# Patient Record
Sex: Female | Born: 1970 | Race: Black or African American | Hispanic: No | Marital: Single | Smoking: Never smoker
Health system: Southern US, Community
[De-identification: ages and names within clinical notes are randomized; demographics above are authoritative.]

## PROBLEM LIST (undated history)

## (undated) DIAGNOSIS — J302 Other seasonal allergic rhinitis: Principal | ICD-10-CM

## (undated) DIAGNOSIS — F411 Generalized anxiety disorder: Secondary | ICD-10-CM

## (undated) DIAGNOSIS — R002 Palpitations: Secondary | ICD-10-CM

## (undated) DIAGNOSIS — G43909 Migraine, unspecified, not intractable, without status migrainosus: Secondary | ICD-10-CM

## (undated) DIAGNOSIS — E7800 Pure hypercholesterolemia, unspecified: Secondary | ICD-10-CM

## (undated) DIAGNOSIS — R011 Cardiac murmur, unspecified: Secondary | ICD-10-CM

## (undated) HISTORY — DX: Pure hypercholesterolemia, unspecified: E78.00

## (undated) HISTORY — DX: Generalized anxiety disorder: F41.1

## (undated) HISTORY — DX: Cardiac murmur, unspecified: R01.1

## (undated) HISTORY — DX: Other seasonal allergic rhinitis: J30.2

## (undated) HISTORY — DX: Migraine, unspecified, not intractable, without status migrainosus: G43.909

## (undated) HISTORY — DX: Palpitations: R00.2

---

## 1998-06-14 ENCOUNTER — Other Ambulatory Visit: Admission: RE | Admit: 1998-06-14 | Discharge: 1998-06-14 | Payer: Self-pay | Admitting: Obstetrics & Gynecology

## 1998-07-28 ENCOUNTER — Encounter: Payer: Self-pay | Admitting: Emergency Medicine

## 1998-07-28 ENCOUNTER — Emergency Department (HOSPITAL_COMMUNITY): Admission: EM | Admit: 1998-07-28 | Discharge: 1998-07-28 | Payer: Self-pay | Admitting: Emergency Medicine

## 1998-08-20 ENCOUNTER — Ambulatory Visit (HOSPITAL_COMMUNITY): Admission: RE | Admit: 1998-08-20 | Discharge: 1998-08-20 | Payer: Self-pay | Admitting: Cardiology

## 1999-06-28 ENCOUNTER — Other Ambulatory Visit: Admission: RE | Admit: 1999-06-28 | Discharge: 1999-06-28 | Payer: Self-pay | Admitting: Obstetrics and Gynecology

## 2001-11-28 ENCOUNTER — Other Ambulatory Visit: Admission: RE | Admit: 2001-11-28 | Discharge: 2001-11-28 | Payer: Self-pay | Admitting: Obstetrics and Gynecology

## 2002-11-19 ENCOUNTER — Other Ambulatory Visit: Admission: RE | Admit: 2002-11-19 | Discharge: 2002-11-19 | Payer: Self-pay | Admitting: Obstetrics and Gynecology

## 2003-02-04 ENCOUNTER — Emergency Department (HOSPITAL_COMMUNITY): Admission: EM | Admit: 2003-02-04 | Discharge: 2003-02-04 | Payer: Self-pay | Admitting: Emergency Medicine

## 2004-09-28 ENCOUNTER — Ambulatory Visit (HOSPITAL_COMMUNITY): Admission: RE | Admit: 2004-09-28 | Discharge: 2004-09-28 | Payer: Self-pay | Admitting: Cardiology

## 2008-07-12 ENCOUNTER — Emergency Department (HOSPITAL_COMMUNITY): Admission: EM | Admit: 2008-07-12 | Discharge: 2008-07-12 | Payer: Self-pay | Admitting: Family Medicine

## 2011-07-04 LAB — POCT URINALYSIS DIP (DEVICE)
Glucose, UA: NEGATIVE
Ketones, ur: NEGATIVE
Operator id: 282151
Protein, ur: 100 — AB
Specific Gravity, Urine: 1.02

## 2011-07-04 LAB — URINE CULTURE

## 2011-07-04 LAB — POCT PREGNANCY, URINE: Preg Test, Ur: NEGATIVE

## 2013-08-18 ENCOUNTER — Encounter: Payer: Self-pay | Admitting: Family Medicine

## 2013-08-18 ENCOUNTER — Ambulatory Visit (INDEPENDENT_AMBULATORY_CARE_PROVIDER_SITE_OTHER): Payer: PRIVATE HEALTH INSURANCE | Admitting: Family Medicine

## 2013-08-18 VITALS — BP 100/68 | Temp 98.6°F | Ht 62.5 in | Wt 198.0 lb

## 2013-08-18 DIAGNOSIS — Z7689 Persons encountering health services in other specified circumstances: Secondary | ICD-10-CM

## 2013-08-18 DIAGNOSIS — F341 Dysthymic disorder: Secondary | ICD-10-CM

## 2013-08-18 DIAGNOSIS — F419 Anxiety disorder, unspecified: Secondary | ICD-10-CM

## 2013-08-18 DIAGNOSIS — F329 Major depressive disorder, single episode, unspecified: Secondary | ICD-10-CM

## 2013-08-18 DIAGNOSIS — E785 Hyperlipidemia, unspecified: Secondary | ICD-10-CM

## 2013-08-18 DIAGNOSIS — Z7189 Other specified counseling: Secondary | ICD-10-CM

## 2013-08-18 MED ORDER — ESCITALOPRAM OXALATE 10 MG PO TABS: 10.0000 mg | ORAL_TABLET | Freq: Every day | ORAL | Status: DC

## 2013-08-18 NOTE — Patient Instructions (Signed)
We recommend the following healthy lifestyle measures: - eat a healthy diet consisting of lots of vegetables, fruits, beans, nuts, seeds, healthy meats such as white chicken and fish and whole grains.  - avoid fried foods, fast food, processed foods, sodas, red meet and other fattening foods.  - get a least 150 minutes of aerobic exercise per week.   Start lexapro -As we discussed, we have prescribed a new medication for you at this appointment. We discussed the common and serious potential adverse effects of this medication and you can review these and more with the pharmacist when you pick up your medication.  Please follow the instructions for use carefully and notify us immediately if you have any problems taking this medication.  Vit D3 1000-3000 IU daily  Schedule appointment with psychiatry  Follow up for physical in 1-2 months

## 2013-08-18 NOTE — Progress Notes (Addendum)
Chief Complaint  Patient presents with  . Establish Care    HPI:  Debra Wagner is here to establish care. Just moved back to [place]. Last PCP and physical: sept with gyn with pap - normal Has labs done with Dr. Sharyn Lull in Cardiology and had done in September  Has the following chronic problems and concerns today:  There are no active problems to display for this patient.  GAD/Anxiety: -since 42 yo -has panic attacks daily -takes xanax 1/2 of a 0.25 mg tab about daily -used to take lexapro - worked well in the past -has a lot of stress in life, mother had stroke, sister passed away suddenly -denies depression, SI, thoughts of self harm -has good support system  HLD/Family hx fatal heart disease: -sees cardiology  -has labs with cardiologist -reports benign heart murmur   ROS: See pertinent positives and negatives per HPI.  Past Medical History  Diagnosis Date  . Heart murmur   . High cholesterol     Family History  Problem Relation Age of Onset  . Heart disease    . Stroke    . Hypertension    . Sudden death Other   . Diabetes Other   . Stroke Mother   . Heart disease Father   . Heart disease Sister     History   Social History  . Marital Status: Single    Spouse Name: N/A    Number of Children: N/A  . Years of Education: N/A   Social History Main Topics  . Smoking status: Never Smoker   . Smokeless tobacco: None  . Alcohol Use: No  . Drug Use: None  . Sexual Activity: None   Other Topics Concern  . 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  GENERAL: vitals reviewed and listed above, alert, oriented, appears well hydrated and in no acute distress  HEENT: atraumatic, conjunttiva clear, no obvious abnormalities on inspection of external nose and ears  NECK: no obvious masses on inspection  LUNGS: clear to auscultation bilaterally, no wheezes, rales or rhonchi, good air movement  CV: HRRR, no peripheral edema  MS: moves all extremities without noticeable abnormality  PSYCH: pleasant and cooperative, no obvious depression or anxiety  ASSESSMENT AND PLAN:  Discussed the following assessment and plan:  Anxiety and depression - Plan: escitalopram (LEXAPRO) 10 MG tablet  Encounter to establish care  HLD (hyperlipidemia)  -restarted lexapro after discussion risks -xanax use sparingly and she will see psych for this given fairly sig anxiety/panic disorder -We reviewed the PMH, PSH, FH, SH, Meds and Allergies. -We provided refills for any medications we will prescribe as needed. -We addressed current concerns per orders and patient instructions. -We have asked for records for pertinent exams, studies, vaccines and notes from previous providers. -We have advised patient to follow up  per instructions below. -follow up for CPE - she will have recent labs sent to use from her cardiologist   -Patient advised to return or notify a doctor immediately if symptoms worsen or persist or new concerns arise.  Patient Instructions  We recommend the following healthy lifestyle measures: - eat a healthy diet consisting of lots of vegetables, fruits, beans, nuts, seeds, healthy meats such as white chicken and fish and whole grains.  - avoid fried foods, fast food, processed foods, sodas, red meet and other fattening foods.  - get a least 150 minutes of aerobic exercise per week.   Start lexapro -As we discussed, we  have prescribed a new medication for you at this appointment. We discussed the common and serious potential adverse effects of this medication and you can review these and more with the pharmacist when you pick up your medication.  Please follow the instructions for use carefully and notify us immediately if you have any problems taking this medication.  Vit D3 1000-3000 IU daily  Schedule appointment with psychiatry  Follow up for physical in 1-2 months     [name] R.   Received copy of labs done by cardiologist. Placed in scan box. CMP and lipids.

## 2013-08-18 NOTE — Progress Notes (Signed)
Pre visit review using our clinic review tool, if applicable. No additional management support is needed unless otherwise documented below in the visit note. 

## 2013-09-18 ENCOUNTER — Encounter: Payer: PRIVATE HEALTH INSURANCE | Admitting: Family Medicine

## 2013-10-13 ENCOUNTER — Ambulatory Visit (INDEPENDENT_AMBULATORY_CARE_PROVIDER_SITE_OTHER): Payer: BC Managed Care – PPO | Admitting: Family Medicine

## 2013-10-13 ENCOUNTER — Encounter: Payer: Self-pay | Admitting: Family Medicine

## 2013-10-13 VITALS — BP 118/78 | Temp 98.0°F | Wt 197.0 lb

## 2013-10-13 DIAGNOSIS — F411 Generalized anxiety disorder: Secondary | ICD-10-CM

## 2013-10-13 DIAGNOSIS — Z23 Encounter for immunization: Secondary | ICD-10-CM

## 2013-10-13 DIAGNOSIS — Z0000 Encounter for general adult medical examination without abnormal findings: Secondary | ICD-10-CM

## 2013-10-13 DIAGNOSIS — E538 Deficiency of other specified B group vitamins: Secondary | ICD-10-CM

## 2013-10-13 NOTE — Patient Instructions (Signed)
Vit D3 1000-3000 IU daily   Schedule appointment with psychiatry  Schedule mammogram  -We have ordered labs or studies at this visit. It can take up to 1-2 weeks for results and processing. We will contact you with instructions IF your results are abnormal. Normal results will be released to your [facility]. If you have not heard from us or can not find your results in [facility] in 2 weeks please contact our office.  -PLEASE SIGN UP FOR MYCHART TODAY   We recommend the following healthy lifestyle measures: - eat a healthy diet consisting of lots of vegetables, fruits, beans, nuts, seeds, healthy meats such as white chicken and fish and whole grains.  - avoid fried foods, fast food, processed foods, sodas, red meet and other fattening foods.  - get a least 150 minutes of aerobic exercise per week.   Follow up in: 6 months or as needed

## 2013-10-13 NOTE — Addendum Note (Signed)
Addended by: Azucena FreedMILLNER, Analis Distler C on: 10/13/2013 03:08 PM   Modules accepted: Orders

## 2013-10-13 NOTE — Progress Notes (Signed)
Chief Complaint  Patient presents with  . Annual Exam    HPI:  Here for CPE:  -Concerns today:   GAD: -started lexapro but had odd feeling -saw cards and rxd zoloft -she is going to see psych  Hx of low B12: -used to take b12 shot  -had labs in July 2014 and good  -Diet: started weight watchers  -Vit D, Calcium: no  -Exercise: started 3 days per wee  -Diabetes and Dyslipidemia Screening: done in 04/2013  -Hx of HTN: no  -Vaccines: refused  -pap history: had this sept 2014 per her report and normal  -FDLMP: 5 days ago  -sexual activity: yes, female partner, no new partners  -wants STI testing: no  -FH breast, colon or ovarian ca: see FH  -Alcohol, Tobacco, drug use: see social history  Review of Systems - neg except where stated above   Past Medical History  Diagnosis Date  . Heart murmur   . High cholesterol     No past surgical history on file.  Family History  Problem Relation Age of Onset  . Heart disease    . Stroke    . Hypertension    . Sudden death Other   . Diabetes Other   . Stroke Mother   . Heart disease Father   . Heart disease Sister     History   Social History  . Marital Status: Single    Spouse Name: N/A    Number of Children: N/A  . Years of Education: N/A   Social History Main Topics  . Smoking status: Never Smoker   . Smokeless tobacco: None  . Alcohol Use: No  . Drug Use: None  . Sexual Activity: None   Other Topics Concern  . 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      Home Situation: lives alone      Spiritual Beliefs: Christian      Lifestyle: just started regular exercise             Current outpatient prescriptions:ALPRAZolam (XANAX) 0.25 MG tablet, Take 0.25 mg by mouth as needed for anxiety., Disp: , Rfl: ;  escitalopram (LEXAPRO) 10 MG tablet, Take 1 tablet (10 mg total) by mouth daily., Disp: 30 tablet, Rfl: 3  EXAM:  Filed Vitals:   10/13/13 1432  BP:  118/78  Temp: 98 F (36.7 C)    GENERAL: vitals reviewed and listed below, alert, oriented, appears well hydrated and in no acute distress  HEENT: head atraumatic, PERRLA, normal appearance of eyes, ears, nose and mouth. moist mucus membranes.  NECK: supple, no masses or lymphadenopathy  LUNGS: clear to auscultation bilaterally, no rales, rhonchi or wheeze  CV: HRRR, no peripheral edema or cyanosis, normal pedal pulses  BREAST: normal appearance - no lesions or discharge, on palpation normal breast tissue without any suspicious masses  ABDOMEN: bowel sounds normal, soft, non tender to palpation, no masses, no rebound or guarding  GU: refused  RECTAL: refused  SKIN: no rash or abnormal lesions  MS: normal gait, moves all extremities normally  NEURO: CN II-XII grossly intact, normal muscle strength and sensation to light touch on extremities  PSYCH: normal affect, pleasant and cooperative  ASSESSMENT AND PLAN:  Discussed the following assessment and plan:  Visit for preventive health examination  Generalized anxiety disorder  B12 deficiency - Plan: Methylmalonic acid, serum  -Discussed and advised all [place] preventive services health task force level A  and B recommendations for age, sex and risks.  -Advised at least 150 minutes of exercise per week and a healthy diet low in saturated fats and sweets and consisting of fresh fruits and vegetables, lean meats such as fish and white chicken and whole grains.  -labs, studies and vaccines per orders this encounter  Orders Placed This Encounter  Procedures  . Methylmalonic acid, serum    Patient Instructions  Vit D3 1000-3000 IU daily   Schedule appointment with psychiatry  Schedule mammogram  -We have ordered labs or studies at this visit. It can take up to 1-2 weeks for results and processing. We will contact you with instructions IF your results are abnormal. Normal results will be released to your [facility]. If you  have not heard from [place] or can not find your results in [facility] in 2 weeks please contact our office.  -PLEASE SIGN UP FOR MYCHART TODAY   We recommend the following healthy lifestyle measures: - eat a healthy diet consisting of lots of vegetables, fruits, beans, nuts, seeds, healthy meats such as white chicken and fish and whole grains.  - avoid fried foods, fast food, processed foods, sodas, red meet and other fattening foods.  - get a least 150 minutes of aerobic exercise per week.   Follow up in: 6 months or as needed        Patient advised to return to clinic immediately if symptoms worsen or persist or new concerns.  @LIFEPLAN @  No Follow-up on file.  Kriste BasqueKIM, HANNAH R.

## 2013-10-13 NOTE — Progress Notes (Signed)
Pre visit review using our clinic review tool, if applicable. No additional management support is needed unless otherwise documented below in the visit note. 

## 2013-12-17 ENCOUNTER — Ambulatory Visit (INDEPENDENT_AMBULATORY_CARE_PROVIDER_SITE_OTHER): Payer: BC Managed Care – PPO | Admitting: Family Medicine

## 2013-12-17 ENCOUNTER — Encounter: Payer: Self-pay | Admitting: Family Medicine

## 2013-12-17 VITALS — BP 120/70 | Temp 98.9°F | Wt 196.0 lb

## 2013-12-17 DIAGNOSIS — N951 Menopausal and female climacteric states: Secondary | ICD-10-CM

## 2013-12-17 DIAGNOSIS — R232 Flushing: Secondary | ICD-10-CM

## 2013-12-17 LAB — TSH: TSH: 1.53 u[IU]/mL (ref 0.35–5.50)

## 2013-12-17 LAB — T4, FREE: FREE T4: 1 ng/dL (ref 0.60–1.60)

## 2013-12-17 NOTE — Progress Notes (Signed)
Pre visit review using our clinic review tool, if applicable. No additional management support is needed unless otherwise documented below in the visit note. 

## 2013-12-17 NOTE — Progress Notes (Signed)
Chief Complaint  Patient presents with  . burning hot feeling on the inside    HPI:  Acute visit for Hot Flashes: -chronic -reports her psychiatrist feels this is related to anxiety as has anxiety when occurs  -no sweating, fevers, weight loss, skin issues, hair issues -she actually thinks this was a side effect to lexapro and recently stopped this and now is on paxil for two days -reports symptoms only occur if she thinks about it, if she doesn't think about it it doesn't happen -reports FDLMP: 1 month ago -has a period every month -seeing GYN   ROS: See pertinent positives and negatives per HPI.  Past Medical History  Diagnosis Date  . Heart murmur   . High cholesterol     No past surgical history on file.  Family History  Problem Relation Age of Onset  . Heart disease    . Stroke    . Hypertension    . Sudden death Other   . Diabetes Other   . Stroke Mother   . Heart disease Father   . Heart disease Sister     History   Social History  . Marital Status: Single    Spouse Name: N/A    Number of Children: N/A  . Years of Education: N/A   Social History Main Topics  . Smoking status: Never Smoker   . Smokeless tobacco: None  . Alcohol Use: No  . Drug Use: None  . Sexual Activity: None   Other Topics Concern  . 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  GENERAL: vitals reviewed and listed above, alert, oriented, appears well hydrated and in  no acute distress  HEENT: atraumatic, conjunttiva clear, no obvious abnormalities on inspection of external nose and ears  NECK: no obvious masses on inspection  LUNGS: clear to auscultation bilaterally, no wheezes, rales or rhonchi, good air movement  CV: HRRR, no peripheral edema  MS: moves all extremities without noticeable abnormality  PSYCH: pleasant and cooperative, no obvious depression or anxiety  ASSESSMENT AND PLAN:  Discussed the following assessment and plan:  Hot flashes - Plan: TSH, T4, Free  -we discussed possible serious and likely etiologies, workup and treatment, treatment risks and return precautions -after this discussion, Alexis opted for checking thyroid though seems to be anxiety -follow up in one month -of course, we advised Oni  to return or notify a doctor immediately if symptoms worsen or persist or new concerns arise.  .  -Patient advised to return or notify a doctor immediately if symptoms worsen or persist or new concerns arise.  Patient Instructions  -We have ordered labs or studies at this visit. It can take up to 1-2 weeks for results and processing. We will contact you with instructions IF your results are abnormal. Normal results will be released  to your [facility]. If you have not heard from [place] or can not find your results in [facility] in 2 weeks please contact our office.  -follow up in 1 month          KIM, HANNAH R.

## 2013-12-17 NOTE — Patient Instructions (Signed)
-  We have ordered labs or studies at this visit. It can take up to 1-2 weeks for results and processing. We will contact you with instructions IF your results are abnormal. Normal results will be released to your [facility]. If you have not heard from us or can not find your results in [facility] in 2 weeks please contact our office.  -follow up in 1 month

## 2014-01-07 ENCOUNTER — Other Ambulatory Visit: Payer: Self-pay | Admitting: Obstetrics & Gynecology

## 2014-01-07 ENCOUNTER — Other Ambulatory Visit (HOSPITAL_COMMUNITY)
Admission: RE | Admit: 2014-01-07 | Discharge: 2014-01-07 | Disposition: A | Discharge status: Home / Self Care | Payer: BC Managed Care – PPO | Source: Ambulatory Visit | Attending: Obstetrics & Gynecology | Admitting: Obstetrics & Gynecology

## 2014-01-07 DIAGNOSIS — Z1151 Encounter for screening for human papillomavirus (HPV): Secondary | ICD-10-CM | POA: Insufficient documentation

## 2014-01-07 DIAGNOSIS — Z01419 Encounter for gynecological examination (general) (routine) without abnormal findings: Secondary | ICD-10-CM | POA: Insufficient documentation

## 2014-01-27 ENCOUNTER — Ambulatory Visit (INDEPENDENT_AMBULATORY_CARE_PROVIDER_SITE_OTHER): Payer: BC Managed Care – PPO | Admitting: Family Medicine

## 2014-01-27 ENCOUNTER — Encounter: Payer: Self-pay | Admitting: Family Medicine

## 2014-01-27 VITALS — BP 110/70 | HR 76 | Temp 98.0°F | Ht 62.5 in | Wt 199.0 lb

## 2014-01-27 DIAGNOSIS — T148 Other injury of unspecified body region: Secondary | ICD-10-CM

## 2014-01-27 DIAGNOSIS — W57XXXA Bitten or stung by nonvenomous insect and other nonvenomous arthropods, initial encounter: Secondary | ICD-10-CM

## 2014-01-27 MED ORDER — TRIAMCINOLONE ACETONIDE 0.1 % EX CREA: 1.0000 "application " | TOPICAL_CREAM | Freq: Two times a day (BID) | CUTANEOUS | Status: DC

## 2014-01-27 NOTE — Progress Notes (Signed)
No chief complaint on file.   HPI:  Rash: -started after out in yard last week -new lesions this week -itchy bumps on arms, legs, back -no pets, sister has fleas at their house and she goes there frequently ROS: See pertinent positives and negatives per HPI.  Past Medical History  Diagnosis Date  . Heart murmur   . High cholesterol     No past surgical history on file.  Family History  Problem Relation Age of Onset  . Heart disease    . Stroke    . Hypertension    . Sudden death Other   . Diabetes Other   . Stroke Mother   . Heart disease Father   . Heart disease Sister     History   Social History  . Marital Status: Single    Spouse Name: N/A    Number of Children: N/A  . Years of Education: N/A   Social History Main Topics  . Smoking status: Never Smoker   . Smokeless tobacco: None  . Alcohol Use: No  . Drug Use: None  . Sexual Activity: None   Other Topics Concern  . 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  GENERAL: vitals reviewed and listed above, alert, oriented, appears well hydrated and in no acute distress  HEENT: atraumatic, conjunttiva clear, no obvious abnormalities on inspection of external nose and ears  NECK: no obvious masses on inspection  Skin: scattered insect bites on arms, legs, back  MS: moves all  extremities without noticeable abnormality  PSYCH: pleasant and cooperative, no obvious depression or anxiety  ASSESSMENT AND PLAN:  Discussed the following assessment and plan:  Insect bites - Plan: triamcinolone cream (KENALOG) 0.1 %  -antihistamine, topical steroid cr - discussed risks, how to check for insects and tx discussed -Patient advised to return or notify a doctor immediately if symptoms worsen or persist or new concerns arise.  Patient Instructions  Insect Bite Mosquitoes, flies, fleas, bedbugs, and many other insects can bite. Insect bites are different from insect stings. A sting is when venom is injected into the skin. Some insect bites can transmit infectious diseases. SYMPTOMS  Insect bites usually turn red, swell, and itch for 2 to 4 days. They often go away on their own. TREATMENT  Your caregiver may prescribe antibiotic medicines if a bacterial infection develops in the bite. HOME CARE INSTRUCTIONS  Do not scratch the bite area.  Keep the bite area clean and dry. Wash the bite area thoroughly with soap and water.  Put ice or cool compresses on the bite area.  Put ice in a plastic bag.  Place a towel between your skin and the bag.  Leave the ice on for 20 minutes, 4 times a day for the first 2 to 3 days, or as directed.  You may apply a baking soda paste, cortisone cream, or calamine lotion to the bite area as directed by your caregiver. This can help reduce itching and swelling.  Only take over-the-counter or prescription medicines as directed by your caregiver.  If you are given antibiotics, take them as directed. Finish them even if you start to feel better. You may need a tetanus shot if:  You cannot remember when you had your last tetanus shot.  You have never had a tetanus shot.  The injury broke your skin. If you get a tetanus shot, your arm may swell, get red, and feel warm to the touch. This is common and not a problem. If you need a tetanus  shot and you choose not to have one, there is a rare chance of getting tetanus. Sickness from tetanus can be serious. SEEK IMMEDIATE MEDICAL CARE IF:   You have increased pain, redness, or swelling in the bite area.  You see a red line on the skin coming from the bite.  You have a fever.  You have joint pain.  You have a headache or neck pain.  You have unusual weakness.  You have a rash.  You have chest pain or shortness of breath.  You have abdominal pain, nausea, or vomiting.  You feel unusually tired or sleepy. MAKE SURE YOU:   Understand these instructions.  Will watch your condition.  Will get help right away if you are not doing well or get worse. Document Released: 10/26/2004 Document Revised: 12/11/2011 Document Reviewed: 04/19/2011 [facility] Patient Information 2014 [place], [place].      Terressa KoyanagiHannah R. Shenicka Sunderlin

## 2014-01-27 NOTE — Progress Notes (Signed)
Pre visit review using our clinic review tool, if applicable. No additional management support is needed unless otherwise documented below in the visit note. 

## 2014-01-27 NOTE — Patient Instructions (Signed)
Insect Bite  Mosquitoes, flies, fleas, bedbugs, and many other insects can bite. Insect bites are different from insect stings. A sting is when venom is injected into the skin. Some insect bites can transmit infectious diseases.  SYMPTOMS   Insect bites usually turn red, swell, and itch for 2 to 4 days. They often go away on their own.  TREATMENT   Your caregiver may prescribe antibiotic medicines if a bacterial infection develops in the bite.  HOME CARE INSTRUCTIONS   Do not scratch the bite area.   Keep the bite area clean and dry. Wash the bite area thoroughly with soap and water.   Put ice or cool compresses on the bite area.   Put ice in a plastic bag.   Place a towel between your skin and the bag.   Leave the ice on for 20 minutes, 4 times a day for the first 2 to 3 days, or as directed.   You may apply a baking soda paste, cortisone cream, or calamine lotion to the bite area as directed by your caregiver. This can help reduce itching and swelling.   Only take over-the-counter or prescription medicines as directed by your caregiver.   If you are given antibiotics, take them as directed. Finish them even if you start to feel better.  You may need a tetanus shot if:   You cannot remember when you had your last tetanus shot.   You have never had a tetanus shot.   The injury broke your skin.  If you get a tetanus shot, your arm may swell, get red, and feel warm to the touch. This is common and not a problem. If you need a tetanus shot and you choose not to have one, there is a rare chance of getting tetanus. Sickness from tetanus can be serious.  SEEK IMMEDIATE MEDICAL CARE IF:    You have increased pain, redness, or swelling in the bite area.   You see a red line on the skin coming from the bite.   You have a fever.   You have joint pain.   You have a headache or neck pain.   You have unusual weakness.   You have a rash.   You have chest pain or shortness of breath.    You have abdominal pain, nausea, or vomiting.   You feel unusually tired or sleepy.  MAKE SURE YOU:    Understand these instructions.   Will watch your condition.   Will get help right away if you are not doing well or get worse.  Document Released: 10/26/2004 Document Revised: 12/11/2011 Document Reviewed: 04/19/2011  Exit[employer] Patient Information 2014 [employer].

## 2014-04-10 ENCOUNTER — Encounter: Payer: Self-pay | Admitting: *Deleted

## 2014-04-13 ENCOUNTER — Ambulatory Visit: Payer: BC Managed Care – PPO | Admitting: Family Medicine

## 2014-09-07 ENCOUNTER — Ambulatory Visit (INDEPENDENT_AMBULATORY_CARE_PROVIDER_SITE_OTHER): Payer: BC Managed Care – PPO | Admitting: Family Medicine

## 2014-09-07 ENCOUNTER — Encounter: Payer: Self-pay | Admitting: Family Medicine

## 2014-09-07 VITALS — BP 102/70 | HR 65 | Temp 98.5°F | Ht 62.5 in | Wt 206.9 lb

## 2014-09-07 DIAGNOSIS — G5601 Carpal tunnel syndrome, right upper limb: Secondary | ICD-10-CM

## 2014-09-07 DIAGNOSIS — M545 Low back pain, unspecified: Secondary | ICD-10-CM

## 2014-09-07 DIAGNOSIS — L853 Xerosis cutis: Secondary | ICD-10-CM

## 2014-09-07 DIAGNOSIS — Z23 Encounter for immunization: Secondary | ICD-10-CM

## 2014-09-07 NOTE — Progress Notes (Signed)
HPI:  Acute visit for:  Hand Pain/R hand pain: -pain and occ tingling - thumb mostly -holds phone with this hand -denies: fevers, weakness  R low Back Pain: -started about 2 months ago -mild dull pain R low back -denies radiation, weakness, numbness, bowel or bladder incontinence  Dry itchy skin: -neck and arms -no pain, no oozing  ROS: See pertinent positives and negatives per HPI.  Past Medical History  Diagnosis Date  . Heart murmur   . High cholesterol     No past surgical history on file.  Family History  Problem Relation Age of Onset  . Heart disease    . Stroke    . Hypertension    . Sudden death Other   . Diabetes Other   . Stroke Mother   . Heart disease Father   . Heart disease Sister     History   Social History  . Marital Status: Single    Spouse Name: N/A    Number of Children: N/A  . Years of Education: N/A   Social History Main Topics  . Smoking status: Never Smoker   . Smokeless tobacco: None  . Alcohol Use: No  . Drug Use: None  . Sexual Activity: None   Other Topics Concern  . 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  GENERAL: vitals reviewed and listed above, alert, oriented, appears well hydrated and in no acute distress  HEENT: atraumatic, conjunttiva clear, no obvious abnormalities on inspection of external nose and ears  NECK: no obvious masses on inspection  LUNGS: clear to auscultation bilaterally, no wheezes, rales or rhonchi, good air  movement  CV: HRRR, no peripheral edema  MS: moves all extremities without noticeable abnormality Normal inspection of hands, wrists and arms with normal sensation to light touch and strength and ROM, + phalen's R, neg tinels Normal Gait Normal inspection of back, no obvious scoliosis or leg length descrepancy No bony TTP Soft tissue TTP at: R lumbar paraspinal muscles and QL R -/+ tests: neg trendelenburg,-facet loading, -SLRT, -CLRT, -FABER, -FADIR Normal muscle strength, sensation to light touch and DTRs in LEs bilaterally  PSYCH: pleasant and cooperative, no obvious depression or anxiety  ASSESSMENT AND PLAN:  Discussed the following assessment and plan:  Right-sided low back pain without sciatica  Xerosis of skin  Carpal tunnel syndrome on right  -we discussed possible serious and likely etiologies for symptoms, workup and treatment, treatment risks and return precautions -after this discussion, Lacinda opted for tx per below -follow up advised 4 weeks -of course, we advised Sherene  to return or notify a doctor immediately if symptoms worsen or persist or new concerns arise.  -Patient advised to return or notify a doctor immediately if symptoms worsen or persist or new concerns arise.  Patient Instructions  BEFORE YOU LEAVE: -low back exercises -schedule follow up in  4 weeks  For the wrist: -use cock up brace at night for 3 months  R low back pain: -do the exercises provided at least 4 days per week  For skin: -uses Cerave Cream twice daily  We recommend the following healthy lifestyle measures: - eat a healthy diet consisting of lots of vegetables, fruits, beans, nuts, seeds, healthy meats such as white chicken and fish and whole grains.  - avoid fried foods, fast food, processed foods, sodas, red meet and other fattening foods.  - get a least 150 minutes of aerobic exercise per week.       Kriste BasqueKIM, HANNAH R.

## 2014-09-07 NOTE — Addendum Note (Signed)
Addended by: Johnella MoloneyFUNDERBURK, JO A on: 09/07/2014 09:54 AM   Modules accepted: Orders

## 2014-09-07 NOTE — Progress Notes (Signed)
Pre visit review using our clinic review tool, if applicable. No additional management support is needed unless otherwise documented below in the visit note. 

## 2014-09-07 NOTE — Patient Instructions (Signed)
BEFORE YOU LEAVE: -low back exercises -schedule follow up in 4 weeks  For the wrist: -use cock up brace at night for 3 months  R low back pain: -do the exercises provided at least 4 days per week  For skin: -uses Cerave Cream twice daily  We recommend the following healthy lifestyle measures: - eat a healthy diet consisting of lots of vegetables, fruits, beans, nuts, seeds, healthy meats such as white chicken and fish and whole grains.  - avoid fried foods, fast food, processed foods, sodas, red meet and other fattening foods.  - get a least 150 minutes of aerobic exercise per week.

## 2014-09-28 ENCOUNTER — Ambulatory Visit: Payer: BC Managed Care – PPO | Admitting: Family Medicine

## 2014-12-11 ENCOUNTER — Ambulatory Visit (INDEPENDENT_AMBULATORY_CARE_PROVIDER_SITE_OTHER): Payer: 59 | Admitting: Family Medicine

## 2014-12-11 ENCOUNTER — Encounter: Payer: Self-pay | Admitting: Family Medicine

## 2014-12-11 VITALS — BP 116/80 | HR 77 | Temp 98.4°F | Ht 62.5 in | Wt 197.6 lb

## 2014-12-11 DIAGNOSIS — R002 Palpitations: Secondary | ICD-10-CM

## 2014-12-11 DIAGNOSIS — J302 Other seasonal allergic rhinitis: Secondary | ICD-10-CM

## 2014-12-11 DIAGNOSIS — G43809 Other migraine, not intractable, without status migrainosus: Secondary | ICD-10-CM

## 2014-12-11 DIAGNOSIS — J069 Acute upper respiratory infection, unspecified: Secondary | ICD-10-CM

## 2014-12-11 MED ORDER — FLUTICASONE PROPIONATE 50 MCG/ACT NA SUSP: 2.0000 | Freq: Every day | NASAL | Status: DC

## 2014-12-11 NOTE — Progress Notes (Signed)
Pre visit review using our clinic review tool, if applicable. No additional management support is needed unless otherwise documented below in the visit note. 

## 2014-12-11 NOTE — Progress Notes (Signed)
HPI:  Debra Wagner is a 44 yo F with a PMH sig for severe anxiety here for an acute visit for:  1)Seasonal Allergies/Sinus issues: -reports she has had this before and told migraines and allergies -started: the last 3 days -symptoms: PND, sore throat, headache - band around head, nausea, loose stool, watery itchy eyes, sneezing -has tried: aleve sinus resolved the pain -denies:fevers, body aches, SOB, wheezing, tooth pain, sig sinus pain  Reports has been seeing a cardiologist for > 10 year for palpitations but now her insurance requires a referral for this. Dr. Sharyn LullHarwani.  ROS: See pertinent positives and negatives per HPI.  Past Medical History  Diagnosis Date  . Heart murmur   . High cholesterol     No past surgical history on file.  Family History  Problem Relation Age of Onset  . Heart disease    . Stroke    . Hypertension    . Sudden death Other   . Diabetes Other   . Stroke Mother   . Heart disease Father   . Heart disease Sister     History   Social History  . Marital Status: Single    Spouse Name: N/A  . Number of Children: N/A  . Years of Education: N/A   Social History Main Topics  . Smoking status: Never Smoker   . Smokeless tobacco: Not on file  . Alcohol Use: No  . Drug Use: Not on file  . Sexual Activity: Not on file   Other Topics Concern  . 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  ALPRAZolam (XANAX) 0.25 MG tablet, Take 0.25 mg by mouth as needed for anxiety., Disp: , Rfl:  .  fluticasone (FLONASE) 50 MCG/ACT nasal spray, Place 2 sprays into both nostrils daily., Disp: 16 g, Rfl: 6  EXAM:  Filed Vitals:   12/11/14 1045  BP: 116/80  Pulse: 77  Temp: 98.4 F (36.9 C)    Body mass index is 35.54 kg/(m^2).  GENERAL: vitals reviewed  and listed above, alert, oriented, appears well hydrated and in no acute distress  HEENT: atraumatic, conjunttiva clear, PERRLA, no obvious abnormalities on inspection of external nose and ears, normal appearance of ear canals and TMs, clear nasal congestion, mild post oropharyngeal erythema with PND, no tonsillar edema or exudate, no sinus TTP   NECK: no obvious masses on inspection  LUNGS: clear to auscultation bilaterally, no wheezes, rales or rhonchi, good air movement  CV: HRRR, no peripheral edema  MS: moves all extremities without noticeable abnormality  PSYCH: pleasant and cooperative, no obvious depression or anxiety  NEURO: normal gait, normal speech and gross visual acuity, CN II-XII grossly intact, normal finger to nose, noram strength and sensation to light touch throughout upper and lower extremities  ASSESSMENT AND PLAN:  Discussed the following assessment and plan:  Other migraine without status migrainosus, not intractable  Seasonal allergies  Acute upper respiratory infection  Palpitations - Plan: Ambulatory referral to Cardiology  -we discussed likely suffering from a combination of viral illness and seasonal allergies with migraine -opted for treatment per orders and instruction and close follow up -referral per her request due to insurance -Patient advised to return  or notify a doctor immediately if symptoms worsen or persist or new concerns arise.  Patient Instructions  BEFORE YOU LEAVE: -schedule follow up in 1 month -set up TB skin test for her  Start zyrtec once daily at night - for 1 month  Flonase daily for 1 month  Try excedrin migraine for your headaches but don't use more then 2-3 times per week        [name] R.

## 2014-12-11 NOTE — Patient Instructions (Signed)
BEFORE YOU LEAVE: -schedule follow up in 1 month -set up TB skin test for her  Start zyrtec once daily at night - for 1 month  Flonase daily for 1 month  Try excedrin migraine for your headaches but don't use more then 2-3 times per week

## 2015-01-13 ENCOUNTER — Ambulatory Visit: Payer: 59 | Admitting: Family Medicine

## 2015-01-19 ENCOUNTER — Ambulatory Visit (INDEPENDENT_AMBULATORY_CARE_PROVIDER_SITE_OTHER): Payer: 59 | Admitting: Family Medicine

## 2015-01-19 ENCOUNTER — Encounter: Payer: Self-pay | Admitting: Family Medicine

## 2015-01-19 VITALS — BP 118/82 | HR 69 | Temp 98.2°F | Ht 62.5 in | Wt 197.5 lb

## 2015-01-19 DIAGNOSIS — R002 Palpitations: Secondary | ICD-10-CM

## 2015-01-19 DIAGNOSIS — Z23 Encounter for immunization: Secondary | ICD-10-CM

## 2015-01-19 DIAGNOSIS — F411 Generalized anxiety disorder: Secondary | ICD-10-CM

## 2015-01-19 DIAGNOSIS — Z114 Encounter for screening for human immunodeficiency virus [HIV]: Secondary | ICD-10-CM

## 2015-01-19 DIAGNOSIS — Z6835 Body mass index (BMI) 35.0-35.9, adult: Secondary | ICD-10-CM

## 2015-01-19 DIAGNOSIS — E785 Hyperlipidemia, unspecified: Secondary | ICD-10-CM

## 2015-01-19 DIAGNOSIS — J302 Other seasonal allergic rhinitis: Secondary | ICD-10-CM

## 2015-01-19 DIAGNOSIS — G43809 Other migraine, not intractable, without status migrainosus: Secondary | ICD-10-CM | POA: Diagnosis not present

## 2015-01-19 DIAGNOSIS — G43909 Migraine, unspecified, not intractable, without status migrainosus: Secondary | ICD-10-CM

## 2015-01-19 HISTORY — DX: Generalized anxiety disorder: F41.1

## 2015-01-19 HISTORY — DX: Palpitations: R00.2

## 2015-01-19 HISTORY — DX: Migraine, unspecified, not intractable, without status migrainosus: G43.909

## 2015-01-19 HISTORY — DX: Other seasonal allergic rhinitis: J30.2

## 2015-01-19 NOTE — Addendum Note (Signed)
Addended by: Johnella MoloneyFUNDERBURK, JO A on: 01/19/2015 01:52 PM   Modules accepted: Orders

## 2015-01-19 NOTE — Patient Instructions (Signed)
BEFORE YOU LEAVE: -TB skin test -set up fasting lab appointment  Start exercise at least 150 minutes per week   We recommend the following healthy lifestyle measures: - eat a healthy diet consisting of lots of vegetables, fruits, beans, nuts, seeds, healthy meats such as white chicken and fish and whole grains.  - avoid fried foods, fast food, processed foods, sodas, red meet and other fattening foods.

## 2015-01-19 NOTE — Progress Notes (Signed)
HPI:  Follow up:  Seasonal Allergies: -advised flonase and antihistamine last visit -reports: she is doing better - she was doing so much better that she actually stopped the medications -denies:wheezing, persistent symptoms  Migraines: -reports long hx of headaches triggered by sinus issues -headaches described as: -trial excedrin last visit -reports: resolved with tx of allergiest -denies: has only had one headache since the allergy treatment  Palpitations/Murmur: -sees cardiologist for this  Hx severe anxiety/GAD: -sees psychiatrist for this  Obesity/HLD: -diet and exercise: no regular exercise - doing weight watchers but does bad on the weekends -cardiologist used to do her diabetes and lipid checks but she would like to check here   ROS: See pertinent positives and negatives per HPI.  Past Medical History  Diagnosis Date  . Heart murmur   . High cholesterol   . GAD (generalized anxiety disorder) 01/19/2015    Sees psychiatrist   . Palpitations 01/19/2015    Sees cardiologist   . Seasonal allergies 01/19/2015  . Migraines 01/19/2015    No past surgical history on file.  Family History  Problem Relation Age of Onset  . Heart disease    . Stroke    . Hypertension    . Sudden death Other   . Diabetes Other   . Stroke Mother   . Heart disease Father   . Heart disease Sister     History   Social History  . Marital Status: Single    Spouse Name: N/A  . Number of Children: N/A  . Years of Education: N/A   Social History Main Topics  . Smoking status: Never Smoker   . Smokeless tobacco: Not on file  . Alcohol Use: No  . Drug Use: Not on file  . Sexual Activity: Not on file   Other Topics Concern  . 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   ALPRAZolam (XANAX) 0.25 MG tablet, Take 0.25 mg by mouth as needed for anxiety., Disp: , Rfl:   EXAM:  Filed Vitals:   01/19/15 1304  BP: 118/82  Pulse: 69  Temp: 98.2 F (36.8 C)    Body mass index is 35.53 kg/(m^2).  GENERAL: vitals reviewed and listed above, alert, oriented, appears well hydrated and in no acute distress  HEENT: atraumatic, conjunttiva clear, no obvious abnormalities on inspection of external nose and ears  NECK: no obvious masses on inspection  LUNGS: clear to auscultation bilaterally, no wheezes, rales or rhonchi, good air movement  CV: HRRR, no peripheral edema  MS: moves all extremities without noticeable abnormality  PSYCH: pleasant and cooperative, no obvious depression or anxiety  ASSESSMENT AND PLAN:  Discussed the following assessment and plan:  Seasonal allergies  Other migraine without status migrainosus, not intractable  Palpitations  GAD (generalized anxiety disorder)  BMI 35.0-35.9,adult  Hyperlipemia - Plan: Hemoglobin A1c, Lipid panel  Screening for HIV (human immunodeficiency virus) - Plan: HIV antibody (with reflex)  -she will schedule FASTING labs -lifestyle recs discussed at length -headaches and allergies improving -Patient advised to return or notify a doctor immediately if symptoms worsen or persist or new concerns arise.  Patient Instructions  BEFORE YOU LEAVE: -TB skin test -set up  fasting lab appointment  Start exercise at least 150 minutes per week   We recommend the following healthy lifestyle measures: - eat a healthy diet consisting of lots of vegetables, fruits, beans, nuts, seeds, healthy meats such as white chicken and fish and whole grains.  - avoid fried foods, fast food, processed foods, sodas, red meet and other fattening foods.         Kriste Basque R.

## 2015-01-19 NOTE — Progress Notes (Signed)
Pre visit review using our clinic review tool, if applicable. No additional management support is needed unless otherwise documented below in the visit note. 

## 2015-01-22 ENCOUNTER — Other Ambulatory Visit (INDEPENDENT_AMBULATORY_CARE_PROVIDER_SITE_OTHER): Payer: 59

## 2015-01-22 DIAGNOSIS — E785 Hyperlipidemia, unspecified: Secondary | ICD-10-CM

## 2015-01-22 DIAGNOSIS — Z114 Encounter for screening for human immunodeficiency virus [HIV]: Secondary | ICD-10-CM

## 2015-01-22 LAB — LIPID PANEL
CHOL/HDL RATIO: 4
Cholesterol: 177 mg/dL (ref 0–200)
HDL: 44 mg/dL (ref >39.00)
LDL CALC: 123 mg/dL — AB (ref 0–99)
NonHDL: 133
Triglycerides: 49 mg/dL (ref 0.0–149.0)
VLDL: 9.8 mg/dL (ref 0.0–40.0)

## 2015-01-22 LAB — TB SKIN TEST
INDURATION: 0 mm
TB SKIN TEST: NEGATIVE

## 2015-01-22 LAB — HEMOGLOBIN A1C: Hgb A1c MFr Bld: 5.6 % (ref 4.6–6.5)

## 2015-01-23 LAB — HIV ANTIBODY (ROUTINE TESTING W REFLEX): HIV 1&2 Ab, 4th Generation: NONREACTIVE

## 2015-05-18 ENCOUNTER — Ambulatory Visit (INDEPENDENT_AMBULATORY_CARE_PROVIDER_SITE_OTHER): Payer: 59 | Admitting: Family Medicine

## 2015-05-18 ENCOUNTER — Encounter: Payer: Self-pay | Admitting: Family Medicine

## 2015-05-18 VITALS — BP 112/80 | HR 82 | Temp 98.1°F | Ht 62.5 in | Wt 197.9 lb

## 2015-05-18 DIAGNOSIS — N946 Dysmenorrhea, unspecified: Secondary | ICD-10-CM

## 2015-05-18 DIAGNOSIS — R300 Dysuria: Secondary | ICD-10-CM

## 2015-05-18 DIAGNOSIS — N760 Acute vaginitis: Secondary | ICD-10-CM

## 2015-05-18 LAB — POCT URINALYSIS DIPSTICK
BILIRUBIN UA: NEGATIVE
GLUCOSE UA: NEGATIVE
Ketones, UA: NEGATIVE
Leukocytes, UA: NEGATIVE
NITRITE UA: NEGATIVE
Protein, UA: 15
Urobilinogen, UA: 0.2
pH, UA: 6

## 2015-05-18 MED ORDER — FLUCONAZOLE 150 MG PO TABS: 150.0000 mg | ORAL_TABLET | Freq: Once | ORAL | Status: DC

## 2015-05-18 NOTE — Addendum Note (Signed)
Addended by: Johnella Moloney on: 05/18/2015 11:19 AM   Modules accepted: Orders

## 2015-05-18 NOTE — Progress Notes (Signed)
HPI:  Debra Wagner is a very anxious patient (seeing a psychiatrist for this) here for an acute visit for:  Vulvovag pruritisDysuria: -started a few days ago -burning with urination, vulvovag pruritis -denies: discharge, pelvic pain, fevers -FDLMP: today -hx of yeast infection and used diflucan and she wants to use this -not sexually active and declined testing for STIs or pelvic today  Dysmenorrhea: -ha, fatigue, nausea last week for a few days, now resolved for the most part -FDLMP: today   ROS: See pertinent positives and negatives per HPI.  Past Medical History  Diagnosis Date  . Heart murmur   . High cholesterol   . GAD (generalized anxiety disorder) 01/19/2015    Sees psychiatrist   . Palpitations 01/19/2015    Sees cardiologist   . Seasonal allergies 01/19/2015  . Migraines 01/19/2015    No past surgical history on file.  Family History  Problem Relation Age of Onset  . Heart disease    . Stroke    . Hypertension    . Sudden death Other   . Diabetes Other   . Stroke Mother   . Heart disease Father   . Heart disease Sister     Social History   Social History  . Marital Status: Single    Spouse Name: N/A  . Number of Children: N/A  . Years of Education: N/A   Social History Main Topics  . Smoking status: Never Smoker   . Smokeless tobacco: None  . Alcohol Use: No  . Drug Use: None  . Sexual Activity: Not Asked   Other Topics Concern  . 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  ALPRAZolam (XANAX) 0.25 MG tablet, Take 0.25 mg by mouth as needed for anxiety., Disp: , Rfl:  .  fluconazole (DIFLUCAN) 150 MG tablet, Take 1 tablet (150 mg total) by mouth once. Repeat in 1 week if needed., Disp: 2 tablet, Rfl: 0  EXAM:  Filed Vitals:   05/18/15  1035  BP: 112/80  Pulse: 82  Temp: 98.1 F (36.7 C)    Body mass index is 35.6 kg/(m^2).  GENERAL: vitals reviewed and listed above, alert, oriented, appears well hydrated and in no acute distress  HEENT: atraumatic, conjunttiva clear, no obvious abnormalities on inspection of external nose and ears  NECK: no obvious masses on inspection  LUNGS: clear to auscultation bilaterally, no wheezes, rales or rhonchi, good air movement  CV: HRRR, no peripheral edema  ABD: BS+, soft, NTTP  GU: declined  MS: moves all extremities without noticeable abnormality  PSYCH: pleasant and cooperative, no obvious depression or anxiety  ASSESSMENT AND PLAN:  Discussed the following assessment and plan:  Dysmenorrhea -aleve if needed, symptoms now resolved, discuss options if is a monthly problem  Vaginitis and vulvovaginitis -wil tx empirically for yeast per her desires, discussed other causes and evaluation, declined STI testing for now but agrees to follow up if symptoms persist  Dysuria -UTI unlikely, will likely have blood on urine given on period, will check culture only to eval for UTI  -Patient advised to return or notify a doctor immediately if symptoms worsen or persist or new concerns arise.  Patient Instructions  Diflucan per instructions  Aleve if needed for aches and period related discomfort per instructions  Follow up if any symptoms persist     Robet Crutchfield R.

## 2015-05-18 NOTE — Patient Instructions (Signed)
Diflucan per instructions  Aleve if needed for aches and period related discomfort per instructions  Follow up if any symptoms persist

## 2015-05-18 NOTE — Progress Notes (Signed)
Pre visit review using our clinic review tool, if applicable. No additional management support is needed unless otherwise documented below in the visit note. 

## 2015-05-20 LAB — URINE CULTURE
COLONY COUNT: NO GROWTH
ORGANISM ID, BACTERIA: NO GROWTH

## 2015-06-25 ENCOUNTER — Ambulatory Visit (INDEPENDENT_AMBULATORY_CARE_PROVIDER_SITE_OTHER): Payer: 59 | Admitting: Family Medicine

## 2015-06-25 ENCOUNTER — Encounter: Payer: Self-pay | Admitting: Family Medicine

## 2015-06-25 VITALS — BP 102/72 | HR 70 | Temp 98.0°F | Ht 62.5 in | Wt 199.0 lb

## 2015-06-25 DIAGNOSIS — G43809 Other migraine, not intractable, without status migrainosus: Secondary | ICD-10-CM | POA: Diagnosis not present

## 2015-06-25 DIAGNOSIS — J302 Other seasonal allergic rhinitis: Secondary | ICD-10-CM | POA: Diagnosis not present

## 2015-06-25 DIAGNOSIS — Z23 Encounter for immunization: Secondary | ICD-10-CM | POA: Diagnosis not present

## 2015-06-25 NOTE — Progress Notes (Signed)
HPI:  Debra Wagner is a 44 yo F with a PMH significant for anxiety (sees psychaitrist), Migraines and seasonal allergies here for an acute visit for:  Migraine: -worse with allergies and colds in the past -started back up again a week ago and feels is sinus related as zyrtec resolves headache, but then symptoms recur if she stops the zyrtec -used excedrine migraine in the past -symptoms: sinus congestion and pressure, headaches by frontal, eye strain at times -she is concerned for a brain tumor -denies: change from prior headaches, fevers, chills, sinus pain, tooth pain, weakness, nubness  ROS: See pertinent positives and negatives per HPI.  Past Medical History  Diagnosis Date  . Heart murmur   . High cholesterol   . GAD (generalized anxiety disorder) 01/19/2015    Sees psychiatrist   . Palpitations 01/19/2015    Sees cardiologist   . Seasonal allergies 01/19/2015  . Migraines 01/19/2015    No past surgical history on file.  Family History  Problem Relation Age of Onset  . Heart disease    . Stroke    . Hypertension    . Sudden death Other   . Diabetes Other   . Stroke Mother   . Heart disease Father   . Heart disease Sister     Social History   Social History  . Marital Status: Single    Spouse Name: N/A  . Number of Children: N/A  . Years of Education: N/A   Social History Main Topics  . Smoking status: Never Smoker   . Smokeless tobacco: None  . Alcohol Use: No  . Drug Use: None  . Sexual Activity: Not Asked   Other Topics Concern  . 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  ALPRAZolam (XANAX) 0.25 MG tablet, Take 0.25 mg by mouth as needed for anxiety., Disp: , Rfl:   EXAM:  Filed Vitals:   06/25/15 1259  BP: 102/72  Pulse: 70  Temp: 98 F  (36.7 C)    Body mass index is 35.8 kg/(m^2).  GENERAL: vitals reviewed and listed above, alert, oriented, appears well hydrated and in no acute distress  HEENT: atraumatic, conjunttiva clear, PERRLA, no obvious abnormalities on inspection of external nose and ears, normal appearance of ear canals and TMs except for clear effusion on L, clear nasal congestion, boggy pale turbinates mild post oropharyngeal erythema with PND, no tonsillar edema or exudate, no sinus TTP  NECK: no obvious masses on inspection, no meningeal signs  LUNGS: clear to auscultation bilaterally, no wheezes, rales or rhonchi, good air movement  CV: HRRR, no peripheral edema  MS: moves all extremities without noticeable abnormality  PSYCH: pleasant and cooperative, no obvious depression or anxiety  NEURO: Cn II-XII grossly intact, finger to nose normal, speech and thought processing grossly intact, gait normal  ASSESSMENT AND PLAN:  Discussed the following assessment and plan:  Other migraine without status migrainosus, not intractable  Seasonal allergies  -we discussed possible serious and likely etiologies, workup and treatment, treatment risks and return precautions with allergies and migraines likely  -discussed tx and options for further eval including MRI -after this discussion, Treacy opted for trial treatments that have  worked in the past, close follow up and MRI if not resolved -follow up advised in 3 weeks -of course, we advised Raiden  to return or notify a doctor immediately if symptoms worsen or persist or new concerns arise.   -Patient advised to return or notify a doctor immediately if symptoms worsen or persist or new concerns arise.  Patient Instructions  BEFORE YOU LEAVE: -schedule follow up in 3-4 weeks  flonase 2 sprays each nostril daily  Zyrtec every night  Can use excedrin migraine 2 days per week; but don't use this or any oth over the counter headache or pain mediations more  often  Can use topical menthol (tiger balm) for pain as well     KIM, HANNAH R.

## 2015-06-25 NOTE — Patient Instructions (Signed)
BEFORE YOU LEAVE: -schedule follow up in 3-4 weeks  flonase 2 sprays each nostril daily  Zyrtec every night  Can use excedrin migraine 2 days per week; but don't use this or any oth over the counter headache or pain mediations more often  Can use topical menthol (tiger balm) for pain as well

## 2015-06-25 NOTE — Progress Notes (Signed)
Pre visit review using our clinic review tool, if applicable. No additional management support is needed unless otherwise documented below in the visit note. 

## 2015-07-23 ENCOUNTER — Ambulatory Visit (INDEPENDENT_AMBULATORY_CARE_PROVIDER_SITE_OTHER): Payer: 59 | Admitting: Family Medicine

## 2015-07-23 DIAGNOSIS — R69 Illness, unspecified: Secondary | ICD-10-CM

## 2015-07-23 NOTE — Progress Notes (Signed)
NO SHOW

## 2016-05-09 LAB — COMPREHENSIVE METABOLIC PANEL
Albumin: 3.8 (ref 3.5–5.0)
Calcium: 8.8 (ref 8.7–10.7)

## 2016-05-09 LAB — BASIC METABOLIC PANEL
BUN: 7 (ref 4–21)
CO2: 24 — AB (ref 13–22)
Chloride: 108 (ref 99–108)
Creatinine: 0.7 (ref 0.5–1.1)
Glucose: 92
Potassium: 4.2 (ref 3.4–5.3)
Sodium: 138 (ref 137–147)

## 2016-05-09 LAB — HEPATIC FUNCTION PANEL
ALT: 9 (ref 7–35)
AST: 13 (ref 13–35)
Alkaline Phosphatase: 64 (ref 25–125)
Bilirubin, Direct: 0.1
Bilirubin, Total: 0.6

## 2016-05-09 LAB — TSH: TSH: 2.74 (ref <=5.90)

## 2016-10-12 ENCOUNTER — Encounter: Payer: Self-pay | Admitting: Family Medicine

## 2016-10-12 ENCOUNTER — Ambulatory Visit (INDEPENDENT_AMBULATORY_CARE_PROVIDER_SITE_OTHER): Payer: Self-pay | Admitting: Family Medicine

## 2016-10-12 VITALS — BP 124/78 | HR 80 | Temp 98.2°F | Ht 62.5 in | Wt 202.8 lb

## 2016-10-12 DIAGNOSIS — G43809 Other migraine, not intractable, without status migrainosus: Secondary | ICD-10-CM

## 2016-10-12 DIAGNOSIS — F410 Panic disorder [episodic paroxysmal anxiety] without agoraphobia: Secondary | ICD-10-CM

## 2016-10-12 DIAGNOSIS — F411 Generalized anxiety disorder: Secondary | ICD-10-CM

## 2016-10-12 DIAGNOSIS — J3089 Other allergic rhinitis: Secondary | ICD-10-CM

## 2016-10-12 MED ORDER — LORAZEPAM 0.5 MG PO TABS: ORAL_TABLET | ORAL | 0 refills | Status: DC

## 2016-10-12 NOTE — Patient Instructions (Signed)
BEFORE YOU LEAVE: -follow up: in 1 month  See the psychiatrist as planned - would suggest getting regular cognitive behavioral therapy and possibly starting and SSRI. Would advise trying to limit use of medications such as xanax, ativan, etc. if possible.  Restart zyrtec and take nightly.  Keep a journal of migraines.

## 2016-10-12 NOTE — Progress Notes (Signed)
Pre visit review using our clinic review tool, if applicable. No additional management support is needed unless otherwise documented below in the visit note. 

## 2016-10-12 NOTE — Progress Notes (Signed)
HPI:  Debra Wagner  generalized anxiety, panic disorder, allergies and migraines here for  An acute visit for worsening anxiety.  She did not show up for her last visit for follow-up of her headaches and reports this is headaches resolved when she took Zyrtec on a regular basis. She was doing quite well, but then had some worsening of anxiety about a week ago and thought the Zyrtec might be causing it. She admits she has a long history of generalized anxiety disorder and with intermittent fluctuations in severity and panic disorder. In the past she was on an SSRI remotely and this seemed to help. She also saw a psychiatrist in the past. [facility]  When she stopped the Zyrtec, her sleep worse and she has had several headaches. She continues to have generalized anxiety and panic attacks almost daily. She has been using her Xanax, and she is very anxious about using this medicine on a regular basis.  She has her the longer acting benzodiazepines may be less dangerous.Her panic attacks include severe anxiety, palpitations, tingling of the lips and fingers and tension in her head and neck. She denies depression, suicidal ideation, manic symptoms or hallucinations.  ROS: See pertinent positives and negatives per HPI.  Past Medical History:  Diagnosis Date  . GAD (generalized anxiety disorder) 01/19/2015   Sees psychiatrist   . Heart murmur   . High cholesterol   . Migraines 01/19/2015  . Palpitations 01/19/2015   Sees cardiologist   . Seasonal allergies 01/19/2015    No past surgical history on file.  Family History  Problem Relation Age of Onset  . Heart disease    . Stroke    . Hypertension    . Sudden death Other   . Diabetes Other   . Stroke Mother   . Heart disease Father   . Heart disease Sister     Social History   Social History  . Marital status: Single    Spouse name: N/A  . Number of children: N/A  .  Years of education: N/A   Social History Main Topics  . Smoking status: Never Smoker  . Smokeless tobacco: None  . Alcohol use No  . Drug use: Unknown  . Sexual activity: Not Asked   Other Topics Concern  . 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  LORazepam (ATIVAN) 0.5 MG tablet, 1/2 to 1 tablet as needed for panic attack. Do not take more then 2 doses in 1 days., Disp: 10 tablet, Rfl: 0  EXAM:  Vitals:   10/12/16 1333  BP: 124/78  Pulse: 80  Temp: 98.2 F (36.8 C)    Body mass index is 36.5 kg/m.  GENERAL: vitals reviewed and listed above, alert, oriented, appears well hydrated and in no acute distress  HEENT: atraumatic, conjunttiva clear, PERRLA. no obvious abnormalities on inspection of external nose and ears, normal appearance of ear canals and TMs  For right clear effusion, clear nasal congestion  And boggy pale turbinates, mild post oropharyngeal erythema with PND, no tonsillar edema or exudate, no sinus TTP  NECK: no obvious masses on  inspection  LUNGS: clear to auscultation bilaterally, no wheezes, rales or rhonchi, good air movement  CV: HRRR, no peripheral edema  MS: moves all extremities without noticeable abnormality  PSYCH: pleasant and cooperative, no obvious depression or anxiety  ASSESSMENT AND PLAN:  Discussed the following assessment and plan:  GAD (generalized anxiety disorder) Panic disorder - Discussed various treatment options and advised regular cognitive behavioral therapy and consideration of starting an SSRI, with benzodiazepine use for panic as needed cautiously. We did give her a small amount of Ativan as she is desiring to try a longer acting benzo.  Discussed proper use, risks,  Interactions, potential for dependency, addiction  and abuse at length. Did discuss that I do not typically prescribe these for regularly use and I'm glad that she will be seeing a psychiatrist.  Other migraine without status migrainosus, not intractable - It sounds like these resolved with treating her allergies, advised the initiating allergy regimen and keeping a migraine journal with follow-up in 1 month. - as needed Excedrin for rare migraines, advised not to use more than twice per week to prevent rebound headaches  Chronic non-seasonal allergic rhinitis, unspecified trigger - Zyrtec at night  -Patient advised to return or notify a doctor immediately if symptoms worsen or persist or new concerns arise.  Patient Instructions  BEFORE YOU LEAVE: -follow up: in 1 month  See the psychiatrist as planned - would suggest getting regular cognitive behavioral therapy and possibly starting and SSRI. Would advise trying to limit use of medications such as xanax, ativan, etc. if possible.  Restart zyrtec and take nightly.  Keep a journal of migraines.   Kriste Basque R., DO

## 2016-10-31 ENCOUNTER — Telehealth: Payer: Self-pay | Admitting: Family Medicine

## 2016-10-31 NOTE — Telephone Encounter (Signed)
Pt states the 1/2 tab of LORazepam (ATIVAN) 0.5 MG tablet Is not working.  Pt would like to know if ok to take 1 whole tab?  Pt has appt with psychiatrist 11/02/16.  This appt had to be rescheduled due to weather.  Pt states she has plenty of med to get her through to her appt, just needs to advice.

## 2016-11-01 NOTE — Telephone Encounter (Signed)
She can take whole tab for panic attack or severe anxiety not more then twice daily until sees psychiatrist. Thanks.

## 2016-11-01 NOTE — Telephone Encounter (Signed)
Spoke with pt and advised. She is seeing psych tomorrow. Nothing further needed at this time.

## 2016-11-09 ENCOUNTER — Ambulatory Visit: Payer: Self-pay | Admitting: Family Medicine

## 2016-11-13 ENCOUNTER — Encounter: Payer: Self-pay | Admitting: Family Medicine

## 2016-11-13 ENCOUNTER — Ambulatory Visit (INDEPENDENT_AMBULATORY_CARE_PROVIDER_SITE_OTHER): Payer: BLUE CROSS/BLUE SHIELD | Admitting: Family Medicine

## 2016-11-13 VITALS — BP 132/88 | HR 90 | Temp 98.6°F | Ht 62.5 in | Wt 191.0 lb

## 2016-11-13 DIAGNOSIS — E559 Vitamin D deficiency, unspecified: Secondary | ICD-10-CM

## 2016-11-13 DIAGNOSIS — E538 Deficiency of other specified B group vitamins: Secondary | ICD-10-CM

## 2016-11-13 DIAGNOSIS — F411 Generalized anxiety disorder: Secondary | ICD-10-CM | POA: Diagnosis not present

## 2016-11-13 LAB — CBC
HCT: 42.3 % (ref 36.0–46.0)
HEMOGLOBIN: 13.6 g/dL (ref 12.0–15.0)
MCHC: 32.2 g/dL (ref 30.0–36.0)
MCV: 82.7 fl (ref 78.0–100.0)
PLATELETS: 236 10*3/uL (ref 150.0–400.0)
RBC: 5.11 Mil/uL (ref 3.87–5.11)
RDW: 14.2 % (ref 11.5–15.5)
WBC: 5.8 10*3/uL (ref 4.0–10.5)

## 2016-11-13 LAB — VITAMIN B12: Vitamin B-12: 372 pg/mL (ref 211–911)

## 2016-11-13 LAB — BASIC METABOLIC PANEL
BUN: 6 mg/dL (ref 6–23)
CO2: 29 mEq/L (ref 19–32)
Calcium: 9.2 mg/dL (ref 8.4–10.5)
Chloride: 103 mEq/L (ref 96–112)
Creatinine, Ser: 0.66 mg/dL (ref 0.40–1.20)
GFR: 125.2 mL/min (ref >60.00)
Glucose, Bld: 96 mg/dL (ref 70–99)
POTASSIUM: 3.5 meq/L (ref 3.5–5.1)
Sodium: 140 mEq/L (ref 135–145)

## 2016-11-13 LAB — TSH: TSH: 1.7 u[IU]/mL (ref 0.35–4.50)

## 2016-11-13 LAB — VITAMIN D 25 HYDROXY (VIT D DEFICIENCY, FRACTURES): VITD: 8.17 ng/mL — AB (ref 30.00–100.00)

## 2016-11-13 NOTE — Progress Notes (Signed)
HPI:  Debra Wagner is a pleasant 46 yo with a PMH significant for Anxiety, migraines here for follow up. Seeing a [facility] now - [facility]. Also getting counseling. Seeing a counselor today. Considering zoloft vs buspar. Taking prn benzos. Wants to check b12, thyroid and Vit d. Reports b12 deficiency in the past and vit d deficiency. I was not aware of this. Long hx anxiety - reports on a number of medications over 20 years ago for this. No mention of headaches, no reports SI, tingling, weakness, numbness.  ROS: See pertinent positives and negatives per HPI.  Past Medical History:  Diagnosis Date  . GAD (generalized anxiety disorder) 01/19/2015   Sees psychiatrist   . Heart murmur   . High cholesterol   . Migraines 01/19/2015  . Palpitations 01/19/2015   Sees cardiologist   . Seasonal allergies 01/19/2015    No past surgical history on file.  Family History  Problem Relation Age of Onset  . Heart disease    . Stroke    . Hypertension    . Sudden death Other   . Diabetes Other   . Stroke Mother   . Heart disease Father   . Heart disease Sister     Social History   Social History  . Marital status: Single    Spouse name: N/A  . Number of children: N/A  . Years of education: N/A   Social History Main Topics  . Smoking status: Never Smoker  . Smokeless tobacco: Never Used  . Alcohol use No  . Drug use: Unknown  . Sexual activity: Not Asked   Other Topics Concern  . 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  ALPRAZolam (XANAX) 0.25 MG tablet, Take 0.5 tablet daily, Disp: , Rfl: 3  EXAM:  Vitals:   11/13/16 1302  BP: 132/88  Pulse: 90  Temp: 98.6 F (37 C)    Body mass index is 34.38 kg/m.  GENERAL: vitals reviewed and listed above,  alert, oriented, appears well hydrated and in no acute distress  HEENT: atraumatic, conjunttiva clear, no obvious abnormalities on inspection of external nose and ears  NECK: no obvious masses on inspection  LUNGS: clear to auscultation bilaterally, no wheezes, rales or rhonchi, good air movement  CV: HRRR, no peripheral edema  MS: moves all extremities without noticeable abnormality  PSYCH: pleasant and cooperative, no obvious depression or anxiety  ASSESSMENT AND PLAN:  Discussed the following assessment and plan:  Vitamin D deficiency - Plan: VITAMIN D 25 Hydroxy (Vit-D Deficiency, Fractures)  B12 deficiency - Plan: Vitamin B12  GAD (generalized anxiety disorder) - Plan: Basic metabolic panel, CBC, TSH  -anxious, wanted to see if I could take over her anxiety medications as she is not sure which medication she wants to use, not working due to anxiety - advised given degree of symptoms this, concerns and any work restrictions due to her psychiatric health be managed by her psychiatrist -basic labs per her request per her report deficiencies in the past -lifestyle recs -glad she is doing counseling and has visit today - I think this will be helpful -Patient advised to return or notify a doctor immediately if  symptoms worsen or persist or new concerns arise.  Patient Instructions  BEFORE YOU LEAVE: -labs -follow up: yearly for physical and as needed  Follow up with your psychiatrist and psychologist about the anxiety. I do hope you are feeling better soon!  We have ordered labs or studies at this visit. It can take up to 1-2 weeks for results and processing. IF results require follow up or explanation, we will call you with instructions. Clinically stable results will be released to your [facility]. If you have not heard from us or cannot find your results in [facility] in 2 weeks please contact our office at [phone].  We recommend the following healthy lifestyle for LIFE: 1)  Small portions.   Tip: eat off of a salad plate instead of a dinner plate.  Tip: if you need more or a snack choose fruits, veggies and/or a handful of nuts or seeds.  2) Eat a healthy clean diet.  * Tip: Avoid (less then 1 serving per week): processed foods, sweets, sweetened drinks, white starches (rice, flour, bread, potatoes, pasta, etc), red meat, fast foods, butter  *Tip: CHOOSE instead   * 5-9 servings per day of fresh or frozen fruits and vegetables (but not corn, potatoes, bananas, canned or dried fruit)   *nuts and seeds, beans   *olives and olive oil   *small portions of lean meats such as fish and white chicken    *small portions of whole grains  3)Get at least 150 minutes of sweaty aerobic exercise per week.  4)Reduce stress - consider counseling, meditation and relaxation to balance other aspects of your life.            Kriste BasqueKIM, Serigne Kubicek R., DO

## 2016-11-13 NOTE — Patient Instructions (Signed)
BEFORE YOU LEAVE: -labs -follow up: yearly for physical and as needed  Follow up with your psychiatrist and psychologist about the anxiety. I do hope you are feeling better soon!  We have ordered labs or studies at this visit. It can take up to 1-2 weeks for results and processing. IF results require follow up or explanation, we will call you with instructions. Clinically stable results will be released to your [facility]. If you have not heard from us or cannot find your results in [facility] in 2 weeks please contact our office at [phone].  We recommend the following healthy lifestyle for LIFE: 1) Small portions.   Tip: eat off of a salad plate instead of a dinner plate.  Tip: if you need more or a snack choose fruits, veggies and/or a handful of nuts or seeds.  2) Eat a healthy clean diet.  * Tip: Avoid (less then 1 serving per week): processed foods, sweets, sweetened drinks, white starches (rice, flour, bread, potatoes, pasta, etc), red meat, fast foods, butter  *Tip: CHOOSE instead   * 5-9 servings per day of fresh or frozen fruits and vegetables (but not corn, potatoes, bananas, canned or dried fruit)   *nuts and seeds, beans   *olives and olive oil   *small portions of lean meats such as fish and white chicken    *small portions of whole grains  3)Get at least 150 minutes of sweaty aerobic exercise per week.  4)Reduce stress - consider counseling, meditation and relaxation to balance other aspects of your life.

## 2016-11-13 NOTE — Progress Notes (Signed)
Pre visit review using our clinic review tool, if applicable. No additional management support is needed unless otherwise documented below in the visit note. 

## 2016-11-16 NOTE — Addendum Note (Signed)
Addended by: Johnella MoloneyFUNDERBURK, JO A on: 11/16/2016 11:02 AM   Modules accepted: Orders

## 2017-01-31 ENCOUNTER — Other Ambulatory Visit: Payer: Self-pay | Admitting: Obstetrics & Gynecology

## 2017-01-31 ENCOUNTER — Other Ambulatory Visit (HOSPITAL_COMMUNITY)
Admission: RE | Admit: 2017-01-31 | Discharge: 2017-01-31 | Disposition: A | Discharge status: Home / Self Care | Payer: Self-pay | Source: Ambulatory Visit | Attending: Obstetrics & Gynecology | Admitting: Obstetrics & Gynecology

## 2017-01-31 DIAGNOSIS — Z01419 Encounter for gynecological examination (general) (routine) without abnormal findings: Secondary | ICD-10-CM | POA: Insufficient documentation

## 2017-01-31 DIAGNOSIS — Z1151 Encounter for screening for human papillomavirus (HPV): Secondary | ICD-10-CM | POA: Insufficient documentation

## 2017-02-05 LAB — CYTOLOGY - PAP
Adequacy: ABSENT
Diagnosis: NEGATIVE
HPV (WINDOPATH): NOT DETECTED

## 2017-02-08 ENCOUNTER — Encounter: Payer: Self-pay | Admitting: Family Medicine

## 2017-02-08 ENCOUNTER — Ambulatory Visit (INDEPENDENT_AMBULATORY_CARE_PROVIDER_SITE_OTHER): Payer: BLUE CROSS/BLUE SHIELD | Admitting: Family Medicine

## 2017-02-08 VITALS — BP 116/80 | HR 64 | Temp 98.4°F | Ht 62.5 in | Wt 199.9 lb

## 2017-02-08 DIAGNOSIS — B349 Viral infection, unspecified: Secondary | ICD-10-CM

## 2017-02-08 MED ORDER — BENZONATATE 100 MG PO CAPS: 100.0000 mg | ORAL_CAPSULE | Freq: Two times a day (BID) | ORAL | 0 refills | Status: DC

## 2017-02-08 NOTE — Patient Instructions (Signed)
INSTRUCTIONS FOR UPPER RESPIRATORY INFECTION:  -plenty of rest and fluids  -nasal saline wash 2-3 times daily (use prepackaged nasal saline or bottled/distilled water if making your own)   -can use AFRIN nasal spray for drainage and nasal congestion - but do NOT use longer then 3-4 days  -can use tylenol (in no history of liver disease) or ibuprofen (if no history of kidney disease, bowel bleeding or significant heart disease) as directed for aches and sorethroat  -in the winter time, using a humidifier at night is helpful (please follow cleaning instructions)  -if you are taking a cough medication - use only as directed, may also try a teaspoon of honey to coat the throat and throat lozenges. Tessalon sent to the pharmacy.  -for sore throat, salt water gargles can help  -follow up if you have fevers, facial pain, tooth pain, difficulty breathing or are worsening or symptoms persist longer then expected  Upper Respiratory Infection, Adult An upper respiratory infection (URI) is also known as the common cold. It is often caused by a type of germ (virus). Colds are easily spread (contagious). You can pass it to others by kissing, coughing, sneezing, or drinking out of the same glass. Usually, you get better in 1 to 3  weeks.  However, the cough can last for even longer. HOME CARE   Only take medicine as told by your doctor. Follow instructions provided above.  Drink enough water and fluids to keep your pee (urine) clear or pale yellow.  Get plenty of rest.  Return to work when your temperature is < 100 for 24 hours or as told by your doctor. You may use a face mask and wash your hands to stop your cold from spreading. GET HELP RIGHT AWAY IF:   After the first few days, you feel you are getting worse.  You have questions about your medicine.  You have chills, shortness of breath, or red spit (mucus).  You have pain in the face for more then 1-2 days, especially when you bend  forward.  You have a fever, puffy (swollen) neck, pain when you swallow, or white spots in the back of your throat.  You have a bad headache, ear pain, sinus pain, or chest pain.  You have a high-pitched whistling sound when you breathe in and out (wheezing).  You cough up blood.  You have sore muscles or a stiff neck. MAKE SURE YOU:   Understand these instructions.  Will watch your condition.  Will get help right away if you are not doing well or get worse. Document Released: 03/06/2008 Document Revised: 12/11/2011 Document Reviewed: 12/24/2013 [facility] Patient Information 2015 [place], [place]. This information is not intended to replace advice given to you by your health care provider. Make sure you discuss any questions you have with your health care provider.

## 2017-02-08 NOTE — Progress Notes (Signed)
HPI:  Acute visit for upper resp symtoms: -for the last 3 days -symptoms include PND, cough, scratchy throat, sneezing, body aches -denies: fever, chills, SOB, rash, NVD, tick bite, known flu or tick exposure -seeing psych for anxiety and doing better -treated for UTI by gyn last week - dysuria resolved  ROS: See pertinent positives and negatives per HPI.  Past Medical History:  Diagnosis Date  . GAD (generalized anxiety disorder) 01/19/2015   Sees psychiatrist   . Heart murmur   . High cholesterol   . Migraines 01/19/2015  . Palpitations 01/19/2015   Sees cardiologist   . Seasonal allergies 01/19/2015    No past surgical history on file.  Family History  Problem Relation Age of Onset  . Heart disease Unknown   . Stroke Unknown   . Hypertension Unknown   . Sudden death Other   . Diabetes Other   . Stroke Mother   . Heart disease Father   . Heart disease Sister     Social History   Social History  . Marital status: Single    Spouse name: N/A  . Number of children: N/A  . Years of education: N/A   Social History Main Topics  . Smoking status: Never Smoker  . Smokeless tobacco: Never Used  . Alcohol use No  . Drug use: Unknown  . Sexual activity: Not Asked   Other Topics Concern  . 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  ALPRAZolam (XANAX) 0.25 MG tablet, Take 0.5 tablet daily, Disp: , Rfl: 3 .  metoprolol tartrate (LOPRESSOR) 25 MG tablet, Take 25 mg by mouth 2 (two) times daily., Disp: , Rfl:  .  sertraline (ZOLOFT) 50 MG tablet, , Disp: , Rfl: 0 .  benzonatate (TESSALON PERLES) 100 MG capsule, Take 1 capsule (100 mg total) by mouth 2 (two) times daily., Disp: 20 capsule, Rfl: 0  EXAM:  Vitals:   02/08/17 0849  BP: 116/80  Pulse: 64  Temp: 98.4  F (36.9 C)    Body mass index is 35.98 kg/m.  GENERAL: vitals reviewed and listed above, alert, oriented, appears well hydrated and in no acute distress  HEENT: atraumatic, conjunttiva clear, no obvious abnormalities on inspection of external nose and ears, normal appearance of ear canals and TMs except for clear effusion L, clear nasal congestion, mild post oropharyngeal erythema with PND, no tonsillar edema or exudate, no sinus TTP  NECK: no obvious masses on inspection  LUNGS: clear to auscultation bilaterally, no wheezes, rales or rhonchi, good air movement  CV: HRRR, no peripheral edema  MS: moves all extremities without noticeable abnormality  PSYCH: pleasant and cooperative, no obvious depression or anxiety  ASSESSMENT AND PLAN:  Discussed the following assessment and plan:  Viral illness  -we discussed possible serious and likely etiologies, workup and treatment, treatment risks and return precautions - likely viral URI -after this discussion, Debra Wagner opted for symptomatic care, tessalon for cough -follow up advised prn -of course, we advised Adaysha  to return or notify a doctor immediately if symptoms worsen or persist or new concerns arise.   Patient Instructions  INSTRUCTIONS FOR UPPER RESPIRATORY INFECTION:  -plenty of rest and fluids  -  nasal saline wash 2-3 times daily (use prepackaged nasal saline or bottled/distilled water if making your own)   -can use AFRIN nasal spray for drainage and nasal congestion - but do NOT use longer then 3-4 days  -can use tylenol (in no history of liver disease) or ibuprofen (if no history of kidney disease, bowel bleeding or significant heart disease) as directed for aches and sorethroat  -in the winter time, using a humidifier at night is helpful (please follow cleaning instructions)  -if you are taking a cough medication - use only as directed, may also try a teaspoon of honey to coat the throat and throat lozenges. Tessalon  sent to the pharmacy.  -for sore throat, salt water gargles can help  -follow up if you have fevers, facial pain, tooth pain, difficulty breathing or are worsening or symptoms persist longer then expected  Upper Respiratory Infection, Adult An upper respiratory infection (URI) is also known as the common cold. It is often caused by a type of germ (virus). Colds are easily spread (contagious). You can pass it to others by kissing, coughing, sneezing, or drinking out of the same glass. Usually, you get better in 1 to 3  weeks.  However, the cough can last for even longer. HOME CARE   Only take medicine as told by your doctor. Follow instructions provided above.  Drink enough water and fluids to keep your pee (urine) clear or pale yellow.  Get plenty of rest.  Return to work when your temperature is < 100 for 24 hours or as told by your doctor. You may use a face mask and wash your hands to stop your cold from spreading. GET HELP RIGHT AWAY IF:   After the first few days, you feel you are getting worse.  You have questions about your medicine.  You have chills, shortness of breath, or red spit (mucus).  You have pain in the face for more then 1-2 days, especially when you bend forward.  You have a fever, puffy (swollen) neck, pain when you swallow, or white spots in the back of your throat.  You have a bad headache, ear pain, sinus pain, or chest pain.  You have a high-pitched whistling sound when you breathe in and out (wheezing).  You cough up blood.  You have sore muscles or a stiff neck. MAKE SURE YOU:   Understand these instructions.  Will watch your condition.  Will get help right away if you are not doing well or get worse. Document Released: 03/06/2008 Document Revised: 12/11/2011 Document Reviewed: 12/24/2013 [facility] Patient Information 2015 [place] Bay[place]Care, [place]. This information is not intended to replace advice given to you by your health care provider. Make sure you  discuss any questions you have with your health care provider.    Kriste BasqueKIM, Debra R., DO

## 2017-03-13 ENCOUNTER — Encounter: Payer: Self-pay | Admitting: Family Medicine

## 2017-03-13 ENCOUNTER — Ambulatory Visit (INDEPENDENT_AMBULATORY_CARE_PROVIDER_SITE_OTHER): Payer: BLUE CROSS/BLUE SHIELD | Admitting: Family Medicine

## 2017-03-13 VITALS — BP 124/84 | HR 69 | Temp 98.4°F | Ht 62.5 in | Wt 202.0 lb

## 2017-03-13 DIAGNOSIS — J301 Allergic rhinitis due to pollen: Secondary | ICD-10-CM | POA: Diagnosis not present

## 2017-03-13 DIAGNOSIS — R42 Dizziness and giddiness: Secondary | ICD-10-CM

## 2017-03-13 NOTE — Patient Instructions (Signed)
WE NOW OFFER   [facility] FAST TRACK!!!  SAME DAY Appointments for ACUTE CARE  Such as: Sprains, Injuries, cuts, abrasions, rashes, muscle pain, joint pain, back pain Colds, flu, sore throats, headache, allergies, cough, fever  Ear pain, sinus and eye infections Abdominal pain, nausea, vomiting, diarrhea, upset stomach Animal/insect bites  3 Easy Ways to Schedule: Walk-In Scheduling Call in scheduling Mychart Sign-up: [email or web address]         

## 2017-03-13 NOTE — Progress Notes (Signed)
   Subjective:    Patient ID: Debra Wagner, female    DOB: 01/26/1971, 46 y.o.   MRN: [id number]  HPI Here for several days of stuffy head with mild intermittent dizziness. She feels a little off balance if she moves her head quickly. No headache or ST or cough or fever. She typically has allergy problems every spring.   Review of Systems  Constitutional: Negative.   HENT: Positive for congestion, postnasal drip and sinus pressure. Negative for ear discharge, ear pain, sinus pain and sore throat.   Eyes: Negative.   Respiratory: Negative.   Neurological: Positive for dizziness. Negative for headaches.       Objective:   Physical Exam  Constitutional: She is oriented to person, place, and time. She appears well-developed and well-nourished. No distress.  HENT:  Right Ear: External ear normal.  Left Ear: External ear normal.  Nose: Nose normal.  Mouth/Throat: Oropharynx is clear and moist.  Eyes: Conjunctivae and EOM are normal. Pupils are equal, round, and reactive to light.  Neck: No thyromegaly present.  Pulmonary/Chest: Effort normal and breath sounds normal. No respiratory distress. She has no wheezes. She has no rales.  Lymphadenopathy:    She has no cervical adenopathy.  Neurological: She is alert and oriented to person, place, and time.          Assessment & Plan:  She has allergies with sinus congestion and a touch of vertigo. Try Xyzal, Flonase, and Mucinex as needed. Drink fluids.  Gershon CraneStephen Fry, MD

## 2017-06-08 ENCOUNTER — Other Ambulatory Visit: Payer: Self-pay

## 2017-06-08 ENCOUNTER — Encounter: Payer: Self-pay | Admitting: Adult Health

## 2017-06-08 ENCOUNTER — Ambulatory Visit (INDEPENDENT_AMBULATORY_CARE_PROVIDER_SITE_OTHER): Payer: BLUE CROSS/BLUE SHIELD | Admitting: Adult Health

## 2017-06-08 VITALS — BP 118/78 | HR 82 | Temp 98.0°F | Ht 62.5 in | Wt 214.0 lb

## 2017-06-08 DIAGNOSIS — J302 Other seasonal allergic rhinitis: Secondary | ICD-10-CM | POA: Diagnosis not present

## 2017-06-08 NOTE — Progress Notes (Signed)
Subjective:    Patient ID: Debra Wagner, female    DOB: 05/17/1971, 46 y.o.   MRN: [id number]  HPI  46 year old female who  has a past medical history of GAD (generalized anxiety disorder) (01/19/2015); Heart murmur; High cholesterol; Migraines (01/19/2015); Palpitations (01/19/2015); and Seasonal allergies (01/19/2015). She is a patient of Debra Wagner, who I am seeing today for an acute issue. Her symptoms include that of throat itching and pain, generalized body aches, rhinorrhea, dry cough, and nausea. Her symptoms have been present for 2 days.   She has been using zyrtec for the last two days.   Review of Systems See HPI   Past Medical History:  Diagnosis Date  . GAD (generalized anxiety disorder) 01/19/2015   Sees psychiatrist   . Heart murmur   . High cholesterol   . Migraines 01/19/2015  . Palpitations 01/19/2015   Sees cardiologist   . Seasonal allergies 01/19/2015    Social History   Social History  . Marital status: Single    Spouse name: N/A  . Number of children: N/A  . Years of education: N/A   Occupational History  . Not on file.   Social History Main Topics  . Smoking status: Never Smoker  . Smokeless tobacco: Never Used  . Alcohol use No  . Drug use: Unknown  . Sexual activity: Not on file   Other Topics Concern  . 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  Family History  Problem Relation Age of Onset  . Heart disease Unknown   . Stroke Unknown   . Hypertension Unknown   . Sudden death Other   . Diabetes Other   . Stroke Mother   . Heart disease Father   . Heart disease Sister     Allergies  Allergen Reactions  . Sulfa Antibiotics     Current Outpatient Prescriptions on File Prior to Visit  Medication Sig Dispense Refill  . ALPRAZolam  (XANAX) 0.25 MG tablet Take 0.5 tablet daily  3   No current facility-administered medications on file prior to visit.     BP 118/78 (BP Location: Left Arm, Patient Position: Sitting, Cuff Size: Large)   Pulse 82   Temp 98 F (36.7 C) (Oral)   Ht 5' 2.5" (1.588 m)   Wt 214 lb (97.1 kg)   SpO2 98%   BMI 38.52 kg/m        Objective:   Physical Exam  Constitutional: She is oriented to person, place, and time. She appears well-developed and well-nourished. No distress.  HENT:  Right Ear: Hearing, tympanic membrane, external ear and ear canal normal.  Left Ear: Hearing, tympanic membrane, external ear and ear canal normal.  Nose: Rhinorrhea (clear ) present. No mucosal edema. Right sinus exhibits no maxillary sinus tenderness and no frontal sinus tenderness. Left sinus exhibits no maxillary sinus tenderness and no frontal sinus tenderness.  Mouth/Throat: Uvula is midline and mucous membranes are normal. Oropharyngeal exudate (clear PND ) present.  Eyes: Pupils are equal, round, and reactive to light. Conjunctivae and EOM are normal. Right eye exhibits no discharge. Left eye exhibits no discharge.  Cardiovascular: Normal rate, regular rhythm,  normal heart sounds and intact distal pulses.  Exam reveals no gallop and no friction rub.   No murmur heard. Pulmonary/Chest: Effort normal and breath sounds normal. No respiratory distress. She has no wheezes. She has no rales. She exhibits no tenderness.  Neurological: She is alert and oriented to person, place, and time.  Skin: Skin is warm and dry. No rash noted. She is not diaphoretic. No erythema. No pallor.  Psychiatric: She has a normal mood and affect. Her behavior is normal. Judgment and thought content normal.  Vitals reviewed.     Assessment & Plan:  1. Seasonal allergic rhinitis, unspecified trigger - Exam consistent with seasonal alleriges. No concern for sinusitis or strep throat.  - Continue Zyrtec and add Flonase  - Follow up  if no improvement in the next week or sooner if fever develops  Shirline Frees, NP

## 2017-10-12 ENCOUNTER — Ambulatory Visit: Payer: BLUE CROSS/BLUE SHIELD | Admitting: Family Medicine

## 2017-10-12 ENCOUNTER — Encounter: Payer: Self-pay | Admitting: Family Medicine

## 2017-10-12 VITALS — BP 118/72 | HR 86 | Temp 98.7°F | Ht 62.5 in | Wt 222.6 lb

## 2017-10-12 DIAGNOSIS — E559 Vitamin D deficiency, unspecified: Secondary | ICD-10-CM | POA: Diagnosis not present

## 2017-10-12 DIAGNOSIS — F419 Anxiety disorder, unspecified: Secondary | ICD-10-CM | POA: Diagnosis not present

## 2017-10-12 DIAGNOSIS — M791 Myalgia, unspecified site: Secondary | ICD-10-CM

## 2017-10-12 NOTE — Progress Notes (Signed)
HPI:  Acute visit for pain in her arms.  She types a lot.  Also she is taking care of her mother and lifts her mother several times a day.  She has had some intermittent pain that can be in different locations in her hands, arms and upper back.  This can occur on the right or the left and last a few days and then resolved.  Over-the-counter analgesics help.  She saw an orthopedic doctor and they recommended Motrin.  She is afraid to take this however given her Zoloft.  Reports her anxiety is much better.  Denies any weakness, numbness, weight loss, fevers, joint swelling or pain, malaise.  Reports overall she is doing much better.  But she admits she does not eat healthy at all.  Does not drink water.  Does not exercise.  She is not taking vitamin D, though we had told her she needed to.  ROS: See pertinent positives and negatives per HPI.  Past Medical History:  Diagnosis Date  . GAD (generalized anxiety disorder) 01/19/2015   Sees psychiatrist   . Heart murmur   . High cholesterol   . Migraines 01/19/2015  . Palpitations 01/19/2015   Sees cardiologist   . Seasonal allergies 01/19/2015    History reviewed. No pertinent surgical history.  Family History  Problem Relation Age of Onset  . Heart disease Unknown   . Stroke Unknown   . Hypertension Unknown   . Sudden death Other   . Diabetes Other   . Stroke Mother   . Heart disease Father   . Heart disease Sister     Social History   Socioeconomic History  . Marital status: Single    Spouse name: None  . Number of children: None  . Years of education: None  . Highest education level: None  Social Needs  . Financial resource strain: None  . Food insecurity - worry: None  . Food insecurity - inability: None  . Transportation needs - medical: None  . Transportation needs - non-medical: None  Occupational History  . None  Tobacco Use  . Smoking status: Never Smoker  . Smokeless tobacco: Never Used  Substance and Sexual  Activity  . Alcohol use: No  . Drug use: None  . Sexual activity: None  Other Topics Concern  . 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  ALPRAZolam (XANAX) 0.25 MG tablet, Take 0.5 tablet daily, Disp: , Rfl: 3 .  sertraline (ZOLOFT) 100 MG tablet, Take 1 tablet by mouth daily., Disp: , Rfl: 0  EXAM:  Vitals:   10/12/17 1545  BP: 118/72  Pulse: 86  Temp: 98.7 F (37.1 C)    Body mass index is 40.07 kg/m.  GENERAL: vitals reviewed and listed above, alert, oriented, appears well hydrated and in no acute distress  HEENT: atraumatic, conjunttiva clear, no obvious abnormalities on inspection of external nose and ears  NECK: no obvious masses on inspection  LUNGS: clear to auscultation bilaterally, no wheezes, rales or rhonchi, good air movement  CV: HRRR, no peripheral edema  MS: moves all extremities without noticeable abnormality, normal inspection of the head neck shoulders and upper extremities.  Normal strength/DTRs/sensitivity to light  touch in the upper extremities bilaterally.  Normal radial pulses.  No significant tenderness to palpation or findings on exam today.  PSYCH: pleasant and cooperative, no obvious depression or anxiety  ASSESSMENT AND PLAN:  Discussed the following assessment and plan:  Muscle soreness  Vitamin D deficiency  Morbid obesity (HCC)  Anxiety  -we discussed possible serious and likely etiologies, workup and treatment, treatment risks and return precautions -she may have a combination of muscle soreness, deconditioning related to being out of shape, poor diet, lack of exercise and her activities.  No significant findings on exam.  Also may have some mild carpal tunnel syndrome.  She has vitamin D deficiency and is not taking her vitamin  D. -after this discussion, Mardelle opted for restart vitamin D, healthy diet, regular exercise, it would be okay for her to take some over-the-counter Aleve or as needed for the pain, work on weight reduction, wrist brace at night -follow up advised for a physical, check vitamin D, follow-up in the next few months -of course, we advised Jamye  to return or notify a doctor immediately if symptoms worsen or persist or new concerns arise.  Patient Instructions  BEFORE YOU LEAVE -follow up: CPE in 2-3 months  Last weight was 214, 222 today  Start vit D3 1000-2000 IU daily  Cut sugar, sweets and simple starches out of your diet. Eat a clean healthy diet - see below.  Aleve if needed for pain per instructions on label  Start getting regular aerobic exercise and work with a trainer if you can to improve upper body strength  Cock up wrist brace at night - wear loosely, wellgate for women is a good choice.  I hope you are feeling better soon! Seek care promptly if your symptoms worsen, new concerns arise or you are not improving with treatment.    We recommend the following healthy lifestyle for LIFE: 1) Small portions. But, make sure to get regular (at least 3 per day), healthy meals and small healthy snacks if needed.  2) Eat a healthy clean diet.   TRY TO EAT: -at least 5-7 servings of low sugar, colorful, and nutrient rich vegetables per day (not corn, potatoes or bananas.) -berries are the best choice if you wish to eat fruit (only eat small amounts if trying to reduce weight)  -lean meets (fish, white meat of chicken or [place]key) -vegan proteins for some meals - beans or tofu, whole grains, nuts and seeds -Replace bad fats with good fats - good fats include: fish, nuts and seeds, canola oil, olive oil -small amounts of low fat or non fat dairy -small amounts of100 % whole grains - check the lables -drink plenty of water  AVOID: -SUGAR, sweets, anything with added sugar, corn syrup  or sweeteners - must read labels as even foods advertised as "healthy" often are loaded with sugar -if you must have a sweetener, small amounts of stevia may be best -sweetened beverages and artificially sweetened beverages -simple starches (rice, bread, potatoes, pasta, chips, etc - small amounts of 100% whole grains are ok) -red meat, pork, butter -fried foods, fast food, processed food, excessive dairy, eggs and coconut.  3)Get at least 150 minutes of sweaty aerobic exercise per week.  4)Reduce stress - consider counseling, meditation and relaxation to balance other aspects of your life.    Kriste BasqueKIM, HANNAH R., DO

## 2017-10-12 NOTE — Patient Instructions (Addendum)
BEFORE YOU LEAVE -follow up: CPE in 2-3 months  Last weight was 214, 222 today  Start vit D3 1000-2000 IU daily  Cut sugar, sweets and simple starches out of your diet. Eat a clean healthy diet - see below.  Aleve if needed for pain per instructions on label  Start getting regular aerobic exercise and work with a trainer if you can to improve upper body strength  Cock up wrist brace at night - wear loosely, wellgate for women is a good choice.  I hope you are feeling better soon! Seek care promptly if your symptoms worsen, new concerns arise or you are not improving with treatment.    We recommend the following healthy lifestyle for LIFE: 1) Small portions. But, make sure to get regular (at least 3 per day), healthy meals and small healthy snacks if needed.  2) Eat a healthy clean diet.   TRY TO EAT: -at least 5-7 servings of low sugar, colorful, and nutrient rich vegetables per day (not corn, potatoes or bananas.) -berries are the best choice if you wish to eat fruit (only eat small amounts if trying to reduce weight)  -lean meets (fish, white meat of chicken or [place]) -vegan proteins for some meals - beans or tofu, whole grains, nuts and seeds -Replace bad fats with good fats - good fats include: fish, nuts and seeds, canola oil, olive oil -small amounts of low fat or non fat dairy -small amounts of100 % whole grains - check the lables -drink plenty of water  AVOID: -SUGAR, sweets, anything with added sugar, corn syrup or sweeteners - must read labels as even foods advertised as "healthy" often are loaded with sugar -if you must have a sweetener, small amounts of stevia may be best -sweetened beverages and artificially sweetened beverages -simple starches (rice, bread, potatoes, pasta, chips, etc - small amounts of 100% whole grains are ok) -red meat, pork, butter -fried foods, fast food, processed food, excessive dairy, eggs and coconut.  3)Get at least 150 minutes of  sweaty aerobic exercise per week.  4)Reduce stress - consider counseling, meditation and relaxation to balance other aspects of your life.

## 2017-10-25 ENCOUNTER — Ambulatory Visit: Payer: BLUE CROSS/BLUE SHIELD | Admitting: Family Medicine

## 2017-10-25 ENCOUNTER — Encounter: Payer: Self-pay | Admitting: *Deleted

## 2017-10-25 ENCOUNTER — Encounter: Payer: Self-pay | Admitting: Family Medicine

## 2017-10-25 VITALS — BP 110/82 | HR 82 | Temp 98.2°F | Ht 62.5 in | Wt 221.3 lb

## 2017-10-25 DIAGNOSIS — R0989 Other specified symptoms and signs involving the circulatory and respiratory systems: Secondary | ICD-10-CM | POA: Diagnosis not present

## 2017-10-25 LAB — POC INFLUENZA A&B (BINAX/QUICKVUE)
INFLUENZA A, POC: NEGATIVE
INFLUENZA B, POC: NEGATIVE

## 2017-10-25 NOTE — Patient Instructions (Addendum)
BEFORE YOU LEAVE: -rapid flu test -work note -does she want flu shot?   INSTRUCTIONS FOR UPPER RESPIRATORY INFECTION:  -plenty of rest and fluids  -nasal saline wash 2-3 times daily (use prepackaged nasal saline or bottled/distilled water if making your own)   -can use AFRIN nasal spray for drainage and nasal congestion - but do NOT use longer then 3-4 days  -can use tylenol (in no history of liver disease) or ibuprofen (if no history of kidney disease, bowel bleeding or significant heart disease) as directed for aches and sorethroat  -in the winter time, using a humidifier at night is helpful (please follow cleaning instructions)  -if you are taking a cough medication - use only as directed, may also try a teaspoon of honey to coat the throat and throat lozenges. If given a cough medication with codeine or hydrocodone or other narcotic please be advised that this contains a strong and  potentially addicting medication. Please follow instructions carefully, take as little as possible and only use AS NEEDED for severe cough. Discuss potential side effects with your pharmacy. Please do not drive or operate machinery while taking these types of medications. Please do not take other sedating medications, drugs or alcohol while taking this medication without discussing with your doctor.  -for sore throat, salt water gargles can help  -follow up if you have fevers, facial pain, tooth pain, difficulty breathing or are worsening or symptoms persist longer then expected  Upper Respiratory Infection, Adult An upper respiratory infection (URI) is also known as the common cold. It is often caused by a type of germ (virus). Colds are easily spread (contagious). You can pass it to others by kissing, coughing, sneezing, or drinking out of the same glass. Usually, you get better in 1 to 3  weeks.  However, the cough can last for even longer. HOME CARE   Only take medicine as told by your doctor. Follow  instructions provided above.  Drink enough water and fluids to keep your pee (urine) clear or pale yellow.  Get plenty of rest.  Return to work when your temperature is < 100 for 24 hours or as told by your doctor. You may use a face mask and wash your hands to stop your cold from spreading. GET HELP RIGHT AWAY IF:   After the first few days, you feel you are getting worse.  You have questions about your medicine.  You have chills, shortness of breath, or red spit (mucus).  You have pain in the face for more then 1-2 days, especially when you bend forward.  You have a fever, puffy (swollen) neck, pain when you swallow, or white spots in the back of your throat.  You have a bad headache, ear pain, sinus pain, or chest pain.  You have a high-pitched whistling sound when you breathe in and out (wheezing).  You cough up blood.  You have sore muscles or a stiff neck. MAKE SURE YOU:   Understand these instructions.  Will watch your condition.  Will get help right away if you are not doing well or get worse. Document Released: 03/06/2008 Document Revised: 12/11/2011 Document Reviewed: 12/24/2013 [facility] Patient Information 2015 [place], [place]. This information is not intended to replace advice given to you by your health care provider. Make sure you discuss any questions you have with your health care provider.

## 2017-10-25 NOTE — Progress Notes (Signed)
HPI:  Acute visit for respiratory illness: -started: 2-3 days ago, reports feeling better today -symptoms:nasal congestion, sore throat, cough, mild aches -denies:fever, SOB, NVD, tooth pain -has tried: nothing -sick contacts/travel/risks: no reported flu, strep or tick exposure  ROS: See pertinent positives and negatives per HPI.  Past Medical History:  Diagnosis Date  . GAD (generalized anxiety disorder) 01/19/2015   Sees psychiatrist   . Heart murmur   . High cholesterol   . Migraines 01/19/2015  . Palpitations 01/19/2015   Sees cardiologist   . Seasonal allergies 01/19/2015    History reviewed. No pertinent surgical history.  Family History  Problem Relation Age of Onset  . Heart disease Unknown   . Stroke Unknown   . Hypertension Unknown   . Sudden death Other   . Diabetes Other   . Stroke Mother   . Heart disease Father   . Heart disease Sister     Social History   Socioeconomic History  . Marital status: Single    Spouse name: None  . Number of children: None  . Years of education: None  . Highest education level: None  Social Needs  . Financial resource strain: None  . Food insecurity - worry: None  . Food insecurity - inability: None  . Transportation needs - medical: None  . Transportation needs - non-medical: None  Occupational History  . None  Tobacco Use  . Smoking status: Never Smoker  . Smokeless tobacco: Never Used  Substance and Sexual Activity  . Alcohol use: No  . Drug use: None  . Sexual activity: None  Other Topics Concern  . 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  ALPRAZolam (XANAX) 0.25 MG tablet, Take 0.5 tablet daily, Disp: , Rfl: 3 .  sertraline (ZOLOFT) 100 MG tablet, Take 1 tablet by mouth daily., Disp: , Rfl:  0  EXAM:  Vitals:   10/25/17 1039  BP: 110/82  Pulse: 82  Temp: 98.2 F (36.8 C)    Body mass index is 39.83 kg/m.  GENERAL: vitals reviewed and listed above, alert, oriented, appears well hydrated and in no acute distress  HEENT: atraumatic, conjunttiva clear, no obvious abnormalities on inspection of external nose and ears, normal appearance of ear canals and TMs, clear nasal congestion, mild post oropharyngeal erythema with PND, no tonsillar edema or exudate, no sinus TTP  NECK: no obvious masses on inspection  LUNGS: clear to auscultation bilaterally, no wheezes, rales or rhonchi, good air movement  CV: HRRR, no peripheral edema  MS: moves all extremities without noticeable abnormality  PSYCH: pleasant and cooperative, no obvious depression or anxiety  ASSESSMENT AND PLAN:  Discussed the following assessment and plan:  Upper respiratory symptom - Plan: POC Influenza A&B(BINAX/QUICKVUE)  -given HPI and exam findings today, a serious infection or illness is unlikely. We discussed potential etiologies, with VURI being most likely, and advised supportive care and monitoring. We discussed treatment side effects, likely course, antibiotic misuse, transmission, and signs of developing a serious illness. -we discussed possible mild flu - though less likely, she wanted to do rapid test, but opted against tamiflu given duration and feeling better today -of course, we advised to return or  notify a doctor immediately if symptoms worsen or persist or new concerns arise.    Patient Instructions  BEFORE YOU LEAVE: -rapid flu test -work note -does she want flu shot?   INSTRUCTIONS FOR UPPER RESPIRATORY INFECTION:  -plenty of rest and fluids  -nasal saline wash 2-3 times daily (use prepackaged nasal saline or bottled/distilled water if making your own)   -can use AFRIN nasal spray for drainage and nasal congestion - but do NOT use longer then 3-4 days  -can use tylenol (in  no history of liver disease) or ibuprofen (if no history of kidney disease, bowel bleeding or significant heart disease) as directed for aches and sorethroat  -in the winter time, using a humidifier at night is helpful (please follow cleaning instructions)  -if you are taking a cough medication - use only as directed, may also try a teaspoon of honey to coat the throat and throat lozenges. If given a cough medication with codeine or hydrocodone or other narcotic please be advised that this contains a strong and  potentially addicting medication. Please follow instructions carefully, take as little as possible and only use AS NEEDED for severe cough. Discuss potential side effects with your pharmacy. Please do not drive or operate machinery while taking these types of medications. Please do not take other sedating medications, drugs or alcohol while taking this medication without discussing with your doctor.  -for sore throat, salt water gargles can help  -follow up if you have fevers, facial pain, tooth pain, difficulty breathing or are worsening or symptoms persist longer then expected  Upper Respiratory Infection, Adult An upper respiratory infection (URI) is also known as the common cold. It is often caused by a type of germ (virus). Colds are easily spread (contagious). You can pass it to others by kissing, coughing, sneezing, or drinking out of the same glass. Usually, you get better in 1 to 3  weeks.  However, the cough can last for even longer. HOME CARE   Only take medicine as told by your doctor. Follow instructions provided above.  Drink enough water and fluids to keep your pee (urine) clear or pale yellow.  Get plenty of rest.  Return to work when your temperature is < 100 for 24 hours or as told by your doctor. You may use a face mask and wash your hands to stop your cold from spreading. GET HELP RIGHT AWAY IF:   After the first few days, you feel you are getting worse.  You have  questions about your medicine.  You have chills, shortness of breath, or red spit (mucus).  You have pain in the face for more then 1-2 days, especially when you bend forward.  You have a fever, puffy (swollen) neck, pain when you swallow, or white spots in the back of your throat.  You have a bad headache, ear pain, sinus pain, or chest pain.  You have a high-pitched whistling sound when you breathe in and out (wheezing).  You cough up blood.  You have sore muscles or a stiff neck. MAKE SURE YOU:   Understand these instructions.  Will watch your condition.  Will get help right away if you are not doing well or get worse. Document Released: 03/06/2008 Document Revised: 12/11/2011 Document Reviewed: 12/24/2013 [facility] Patient Information 2015 [place], [place]. This information is not intended to replace advice given to you by your health care provider. Make sure you discuss any questions you have with your health care provider.  Hannah R Kim, DO   

## 2017-11-12 ENCOUNTER — Other Ambulatory Visit: Payer: Self-pay | Admitting: Obstetrics & Gynecology

## 2017-11-12 DIAGNOSIS — Z1231 Encounter for screening mammogram for malignant neoplasm of breast: Secondary | ICD-10-CM

## 2017-11-13 ENCOUNTER — Ambulatory Visit
Admission: RE | Admit: 2017-11-13 | Discharge: 2017-11-13 | Disposition: A | Discharge status: Home / Self Care | Payer: BLUE CROSS/BLUE SHIELD | Source: Ambulatory Visit | Attending: Obstetrics & Gynecology | Admitting: Obstetrics & Gynecology

## 2017-11-13 DIAGNOSIS — Z1231 Encounter for screening mammogram for malignant neoplasm of breast: Secondary | ICD-10-CM

## 2017-12-08 NOTE — Progress Notes (Addendum)
HPI:  Here for CPE:  Using dictation device. Unfortunately this device frequently misinterprets words/phrases.  -Concerns and/or follow up today: Due for labs and flu vaccine. She is seeing psychiatry and is doing much better in terms of her anxiety and mood.  She is on Zoloft. She is concerned about her weight.  She feels she has gained about 30 pounds over the last year.  Her periods have been a little irregular and she saw her gynecologist about this.  The first day of her last menstrual period is today. -Diet: variety of foods, balance and well rounded -is thinking about doing weight watchers -Exercise: no regular exercise -Taking folic acid, vitamin D or calcium: no -Diabetes and Dyslipidemia Screening: Fasting for labs -Vaccines: see vaccine section EPIC -pap history: Sees her gynecologist for this, Dr. Nelda Marseille -FDLMP: see nursing notes -sexual activity: yes, female partner, no new partners -wants STI testing (Hep C if born [birth year]-65): Sees GYN -FH breast, colon or ovarian ca: see FH Last mammogram: Not applicable Last colon cancer screening: Not applicable Breast Ca Risk Assessment: see family history and pt history DEXA (>/= 65): Not applicable  -Alcohol, Tobacco, drug use: see social history  Review of Systems - no reported fevers, unintentional weight loss, vision loss, hearing loss, chest pain, sob, hemoptysis, melena, hematochezia, hematuria, genital discharge, changing or concerning skin lesions, bleeding, bruising, loc, thoughts of self harm or SI  Past Medical History:  Diagnosis Date  . GAD (generalized anxiety disorder) 01/19/2015   Sees psychiatrist   . Heart murmur   . High cholesterol   . Migraines 01/19/2015  . Palpitations 01/19/2015   Sees cardiologist   . Seasonal allergies 01/19/2015    History reviewed. No pertinent surgical history.  Family History  Problem Relation Age of Onset  . Heart disease Unknown   . Stroke Unknown   . Hypertension Unknown   .  Sudden death Other   . Diabetes Other   . Stroke Mother   . Heart disease Father   . Heart disease Sister     Social History   Socioeconomic History  . Marital status: Single    Spouse name: None  . Number of children: None  . Years of education: None  . Highest education level: None  Social Needs  . Financial resource strain: None  . Food insecurity - worry: None  . Food insecurity - inability: None  . Transportation needs - medical: None  . Transportation needs - non-medical: None  Occupational History  . None  Tobacco Use  . Smoking status: Never Smoker  . Smokeless tobacco: Never Used  Substance and Sexual Activity  . Alcohol use: No  . Drug use: None  . Sexual activity: None  Other Topics Concern  . None       Current Outpatient Medications:  .  ALPRAZolam (XANAX) 0.25 MG tablet, Take 0.5 tablet daily, Disp: , Rfl: 3 .  sertraline (ZOLOFT) 100 MG tablet, Take 1 tablet by mouth daily., Disp: , Rfl: 0  EXAM:  Vitals:   12/10/17 0919  BP: 110/72  Pulse: 76  Temp: 98.2 F (36.8 C)  Body mass index is 39.36 kg/m.  GENERAL: vitals reviewed and listed below, alert, oriented, appears well hydrated and in no acute distress  HEENT: head atraumatic, PERRLA, normal appearance of eyes, ears, nose and mouth. moist mucus membranes.  NECK: supple, no masses or lymphadenopathy  LUNGS: clear to auscultation bilaterally, no rales, rhonchi or wheeze  CV: HRRR, no peripheral edema  or cyanosis, normal pedal pulses  ABDOMEN: bowel sounds normal, soft, non tender to palpation, no masses, no rebound or guarding  GU/BREAST: Sees gynecologist  SKIN: no rash or abnormal lesions  MS: normal gait, moves all extremities normally  NEURO: normal gait, speech and thought processing grossly intact, muscle tone grossly intact throughout  PSYCH: normal affect, pleasant and cooperative  ASSESSMENT AND PLAN:  Discussed the following assessment and plan:  PREVENTIVE  EXAM: -Discussed and advised all [place] preventive services health task force level A and B recommendations for age, sex and risks. -Advised at least 150 minutes of exercise per week and a healthy diet -see summary recommendations and patient instructions -She opted for flu vaccine after discussion of risk and benefits -labs, studies and vaccines per orders this encounter Sees gynecologist-for GYN exams  2. Obesity/BMI 39.0-39.9,adult -Lifestyle recommendations -summarized below and handout for patient instructions - Lipid panel - Hemoglobin A1c -We also discussed metformin, could use this, particularly if blood sugar is elevated She is thinking about the weight watchers program, I think this would be great as long as she does not eat too much fruit-  3. Irregular menstrual bleeding -Advised follow-up with GYN and will check thyroid - TSH  4. GAD (generalized anxiety disorder) -Seeing psychiatry for management, on SSRI - Basic metabolic panel    Patient Instructions  BEFORE YOU LEAVE: -phq9 in epic -Flu shot -Labs -follow up: 4-6 months  We have ordered labs or studies at this visit. It can take up to 1-2 weeks for results and processing. IF results require follow up or explanation, we will call you with instructions. Clinically stable results will be released to your [facility]. If you have not heard from [place] or cannot find your results in [facility] in 2 weeks please contact our office at [phone].  If you are not yet signed up for [facility], please consider signing up.   We recommend the following healthy lifestyle for LIFE: 1) Small portions. But, make sure to get regular (at least 3 per day), healthy meals and small healthy snacks if needed.  2) Eat a healthy clean diet.   TRY TO EAT: -at least 5-7 servings of low sugar, colorful, and nutrient rich vegetables per day (not corn, potatoes or bananas.) -berries are the best choice if you wish to eat fruit (only eat small amounts if  trying to reduce weight)  -lean meets (fish, white meat of chicken or [place]) -vegan proteins for some meals - beans or tofu, whole grains, nuts and seeds -Replace bad fats with good fats - good fats include: fish, nuts and seeds, canola oil, olive oil -small amounts of low fat or non fat dairy -small amounts of100 % whole grains - check the lables -drink plenty of water  AVOID: -SUGAR, sweets, anything with added sugar, corn syrup or sweeteners - must read labels as even foods advertised as "healthy" often are loaded with sugar -if you must have a sweetener, small amounts of stevia may be best -sweetened beverages and artificially sweetened beverages -simple starches (rice, bread, potatoes, pasta, chips, etc - small amounts of 100% whole grains are ok) -red meat, pork, butter -fried foods, fast food, processed food, excessive dairy, eggs and coconut.  3)Get at least 150 minutes of sweaty aerobic exercise per week.  4)Reduce stress - consider counseling, meditation and relaxation to balance other aspects of your life.    Preventive Care 18-39 Years, Female Preventive care refers to lifestyle choices and visits with your health care  provider that can promote health and wellness. What does preventive care include?  A yearly physical exam. This is also called an annual well check.  Dental exams once or twice a year.  Routine eye exams. Ask your health care provider how often you should have your eyes checked.  Personal lifestyle choices, including: ? Daily care of your teeth and gums. ? Regular physical activity. ? Eating a healthy diet. ? Avoiding tobacco and drug use. ? Limiting alcohol use. ? Practicing safe sex. ? Taking vitamin and mineral supplements as recommended by your health care provider. What happens during an annual well check? The services and screenings done by your health care provider during your annual well check will depend on your age, overall health,  lifestyle risk factors, and family history of disease. Counseling Your health care provider may ask you questions about your:  Alcohol use.  Tobacco use.  Drug use.  Emotional well-being.  Home and relationship well-being.  Sexual activity.  Eating habits.  Work and work Statistician.  Method of birth control.  Menstrual cycle.  Pregnancy history.  Screening You may have the following tests or measurements:  Height, weight, and BMI.  Diabetes screening. This is done by checking your blood sugar (glucose) after you have not eaten for a while (fasting).  Blood pressure.  Lipid and cholesterol levels. These may be checked every 5 years starting at age 54.  Skin check.  Hepatitis C blood test.  Hepatitis B blood test.  Sexually transmitted disease (STD) testing.  BRCA-related cancer screening. This may be done if you have a family history of breast, ovarian, tubal, or peritoneal cancers.  Pelvic exam and Pap test. This may be done every 3 years starting at age 53. Starting at age 77, this may be done every 5 years if you have a Pap test in combination with an HPV test.  Discuss your test results, treatment options, and if necessary, the need for more tests with your health care provider. Vaccines Your health care provider may recommend certain vaccines, such as:  Influenza vaccine. This is recommended every year.  Tetanus, diphtheria, and acellular pertussis (Tdap, Td) vaccine. You may need a Td booster every 10 years.  Varicella vaccine. You may need this if you have not been vaccinated.  HPV vaccine. If you are 62 or younger, you may need three doses over 6 months.  Measles, mumps, and rubella (MMR) vaccine. You may need at least one dose of MMR. You may also need a second dose.  Pneumococcal 13-valent conjugate (PCV13) vaccine. You may need this if you have certain conditions and were not previously vaccinated.  Pneumococcal polysaccharide (PPSV23)  vaccine. You may need one or two doses if you smoke cigarettes or if you have certain conditions.  Meningococcal vaccine. One dose is recommended if you are age 53-21 years and a first-year college student living in a residence hall, or if you have one of several medical conditions. You may also need additional booster doses.  Hepatitis A vaccine. You may need this if you have certain conditions or if you travel or work in places where you may be exposed to hepatitis A.  Hepatitis B vaccine. You may need this if you have certain conditions or if you travel or work in places where you may be exposed to hepatitis B.  Haemophilus influenzae type b (Hib) vaccine. You may need this if you have certain risk factors.  Talk to your health care provider about which screenings and  vaccines you need and how often you need them. This information is not intended to replace advice given to you by your health care provider. Make sure you discuss any questions you have with your health care provider. Document Released: 11/14/2001 Document Revised: 06/07/2016 Document Reviewed: 07/20/2015 Elsevier Interactive Patient Education  2018 [employer].        No Follow-up on file.  [name], DO

## 2017-12-10 ENCOUNTER — Ambulatory Visit (INDEPENDENT_AMBULATORY_CARE_PROVIDER_SITE_OTHER): Payer: BLUE CROSS/BLUE SHIELD | Admitting: Family Medicine

## 2017-12-10 ENCOUNTER — Encounter: Payer: Self-pay | Admitting: Family Medicine

## 2017-12-10 VITALS — BP 110/72 | HR 76 | Temp 98.2°F | Ht 63.0 in | Wt 222.2 lb

## 2017-12-10 DIAGNOSIS — Z1331 Encounter for screening for depression: Secondary | ICD-10-CM | POA: Diagnosis not present

## 2017-12-10 DIAGNOSIS — Z6839 Body mass index (BMI) 39.0-39.9, adult: Secondary | ICD-10-CM | POA: Diagnosis not present

## 2017-12-10 DIAGNOSIS — Z0000 Encounter for general adult medical examination without abnormal findings: Secondary | ICD-10-CM

## 2017-12-10 DIAGNOSIS — F411 Generalized anxiety disorder: Secondary | ICD-10-CM | POA: Diagnosis not present

## 2017-12-10 DIAGNOSIS — N926 Irregular menstruation, unspecified: Secondary | ICD-10-CM

## 2017-12-10 DIAGNOSIS — Z23 Encounter for immunization: Secondary | ICD-10-CM

## 2017-12-10 DIAGNOSIS — E669 Obesity, unspecified: Secondary | ICD-10-CM | POA: Diagnosis not present

## 2017-12-10 LAB — BASIC METABOLIC PANEL
BUN: 14 mg/dL (ref 6–23)
CALCIUM: 9.2 mg/dL (ref 8.4–10.5)
CO2: 29 mEq/L (ref 19–32)
CREATININE: 0.67 mg/dL (ref 0.40–1.20)
Chloride: 106 mEq/L (ref 96–112)
GFR: 122.44 mL/min (ref >60.00)
Glucose, Bld: 102 mg/dL — ABNORMAL HIGH (ref 70–99)
Potassium: 4.3 mEq/L (ref 3.5–5.1)
Sodium: 139 mEq/L (ref 135–145)

## 2017-12-10 LAB — LIPID PANEL
CHOLESTEROL: 195 mg/dL (ref 0–200)
HDL: 51.6 mg/dL (ref >39.00)
LDL CALC: 130 mg/dL — AB (ref 0–99)
NonHDL: 143.46
Total CHOL/HDL Ratio: 4
Triglycerides: 66 mg/dL (ref 0.0–149.0)
VLDL: 13.2 mg/dL (ref 0.0–40.0)

## 2017-12-10 LAB — HEMOGLOBIN A1C: HEMOGLOBIN A1C: 5.9 % (ref 4.6–6.5)

## 2017-12-10 NOTE — Patient Instructions (Addendum)
BEFORE YOU LEAVE: -phq9 in epic -Flu shot -Labs -follow up: 4-6 months  We have ordered labs or studies at this visit. It can take up to 1-2 weeks for results and processing. IF results require follow up or explanation, we will call you with instructions. Clinically stable results will be released to your [facility]. If you have not heard from [place] or cannot find your results in [facility] in 2 weeks please contact our office at [phone].  If you are not yet signed up for [facility], please consider signing up.   We recommend the following healthy lifestyle for LIFE: 1) Small portions. But, make sure to get regular (at least 3 per day), healthy meals and small healthy snacks if needed.  2) Eat a healthy clean diet.   TRY TO EAT: -at least 5-7 servings of low sugar, colorful, and nutrient rich vegetables per day (not corn, potatoes or bananas.) -berries are the best choice if you wish to eat fruit (only eat small amounts if trying to reduce weight)  -lean meets (fish, white meat of chicken or [place]) -vegan proteins for some meals - beans or tofu, whole grains, nuts and seeds -Replace bad fats with good fats - good fats include: fish, nuts and seeds, canola oil, olive oil -small amounts of low fat or non fat dairy -small amounts of100 % whole grains - check the lables -drink plenty of water  AVOID: -SUGAR, sweets, anything with added sugar, corn syrup or sweeteners - must read labels as even foods advertised as "healthy" often are loaded with sugar -if you must have a sweetener, small amounts of stevia may be best -sweetened beverages and artificially sweetened beverages -simple starches (rice, bread, potatoes, pasta, chips, etc - small amounts of 100% whole grains are ok) -red meat, pork, butter -fried foods, fast food, processed food, excessive dairy, eggs and coconut.  3)Get at least 150 minutes of sweaty aerobic exercise per week.  4)Reduce stress - consider counseling, meditation and  relaxation to balance other aspects of your life.    Preventive Care 18-39 Years, Female Preventive care refers to lifestyle choices and visits with your health care provider that can promote health and wellness. What does preventive care include?  A yearly physical exam. This is also called an annual well check.  Dental exams once or twice a year.  Routine eye exams. Ask your health care provider how often you should have your eyes checked.  Personal lifestyle choices, including: ? Daily care of your teeth and gums. ? Regular physical activity. ? Eating a healthy diet. ? Avoiding tobacco and drug use. ? Limiting alcohol use. ? Practicing safe sex. ? Taking vitamin and mineral supplements as recommended by your health care provider. What happens during an annual well check? The services and screenings done by your health care provider during your annual well check will depend on your age, overall health, lifestyle risk factors, and family history of disease. Counseling Your health care provider may ask you questions about your:  Alcohol use.  Tobacco use.  Drug use.  Emotional well-being.  Home and relationship well-being.  Sexual activity.  Eating habits.  Work and work Statistician.  Method of birth control.  Menstrual cycle.  Pregnancy history.  Screening You may have the following tests or measurements:  Height, weight, and BMI.  Diabetes screening. This is done by checking your blood sugar (glucose) after you have not eaten for a while (fasting).  Blood pressure.  Lipid and cholesterol levels. These may  be checked every 5 years starting at age 7.  Skin check.  Hepatitis C blood test.  Hepatitis B blood test.  Sexually transmitted disease (STD) testing.  BRCA-related cancer screening. This may be done if you have a family history of breast, ovarian, tubal, or peritoneal cancers.  Pelvic exam and Pap test. This may be done every 3 years  starting at age 14. Starting at age 42, this may be done every 5 years if you have a Pap test in combination with an HPV test.  Discuss your test results, treatment options, and if necessary, the need for more tests with your health care provider. Vaccines Your health care provider may recommend certain vaccines, such as:  Influenza vaccine. This is recommended every year.  Tetanus, diphtheria, and acellular pertussis (Tdap, Td) vaccine. You may need a Td booster every 10 years.  Varicella vaccine. You may need this if you have not been vaccinated.  HPV vaccine. If you are 32 or younger, you may need three doses over 6 months.  Measles, mumps, and rubella (MMR) vaccine. You may need at least one dose of MMR. You may also need a second dose.  Pneumococcal 13-valent conjugate (PCV13) vaccine. You may need this if you have certain conditions and were not previously vaccinated.  Pneumococcal polysaccharide (PPSV23) vaccine. You may need one or two doses if you smoke cigarettes or if you have certain conditions.  Meningococcal vaccine. One dose is recommended if you are age 39-21 years and a first-year college student living in a residence hall, or if you have one of several medical conditions. You may also need additional booster doses.  Hepatitis A vaccine. You may need this if you have certain conditions or if you travel or work in places where you may be exposed to hepatitis A.  Hepatitis B vaccine. You may need this if you have certain conditions or if you travel or work in places where you may be exposed to hepatitis B.  Haemophilus influenzae type b (Hib) vaccine. You may need this if you have certain risk factors.  Talk to your health care provider about which screenings and vaccines you need and how often you need them. This information is not intended to replace advice given to you by your health care provider. Make sure you discuss any questions you have with your health care  provider. Document Released: 11/14/2001 Document Revised: 06/07/2016 Document Reviewed: 07/20/2015 Elsevier Interactive Patient Education  [employer].

## 2017-12-11 LAB — TSH: TSH: 3.21 u[IU]/mL (ref 0.35–4.50)

## 2017-12-12 ENCOUNTER — Telehealth: Payer: Self-pay | Admitting: Family Medicine

## 2017-12-12 NOTE — Telephone Encounter (Signed)
Copied from CRM [id number]. Topic: Quick Communication - See Telephone Encounter >> Dec 12, 2017  1:34 PM Raquel SarnaHayes, Teresa G wrote: Pt wants to discuss the lab results and medications from last visit.

## 2017-12-12 NOTE — Telephone Encounter (Signed)
Copied from CRM [id number]. Topic: Quick Communication - See Telephone Encounter °>> Dec 12, 2017  1:34 PM Hayes, Teresa G wrote: °Pt wants to discuss the lab results and medications from last visit. °

## 2017-12-13 NOTE — Telephone Encounter (Signed)
See results note. 

## 2017-12-13 NOTE — Telephone Encounter (Signed)
Pt returning call to Marvia PicklesJo Ann for results.

## 2018-01-02 ENCOUNTER — Other Ambulatory Visit: Payer: Self-pay | Admitting: Obstetrics & Gynecology

## 2018-04-10 NOTE — Progress Notes (Deleted)
  HPI:  Using dictation device. Unfortunately this device frequently misinterprets words/phrases.  Debra Wagner is a pleasant 47 y.o. here for follow up. Chronic medical problems summarized below were reviewed for changes and stability and were updated as needed below. These issues and their treatment remain stable for the most part. ***. Denies CP, SOB, DOE, treatment intolerance or new symptoms.  GAD: -seeing psychiatry for management  Morbid Obesity/Hyperglycemia/Hyperlipidemia: -wt 222 11/2017 --> -healthy low sugar diet and regular aerobic exercise advised  ROS: See pertinent positives and negatives per HPI.  Past Medical History:  Diagnosis Date  . GAD (generalized anxiety disorder) 01/19/2015   Sees psychiatrist   . Heart murmur   . High cholesterol   . Migraines 01/19/2015  . Palpitations 01/19/2015   Sees cardiologist   . Seasonal allergies 01/19/2015    No past surgical history on file.  Family History  Problem Relation Age of Onset  . Heart disease Unknown   . Stroke Unknown   . Hypertension Unknown   . Sudden death Other   . Diabetes Other   . Stroke Mother   . Heart disease Father   . Heart disease Sister     SOCIAL HX: ***   Current Outpatient Medications:  .  ALPRAZolam (XANAX) 0.25 MG tablet, Take 0.5 tablet daily, Disp: , Rfl: 3 .  sertraline (ZOLOFT) 100 MG tablet, Take 1 tablet by mouth daily., Disp: , Rfl: 0  EXAM:  There were no vitals filed for this visit.  There is no height or weight on file to calculate BMI.  GENERAL: vitals reviewed and listed above, alert, oriented, appears well hydrated and in no acute distress  HEENT: atraumatic, conjunttiva clear, no obvious abnormalities on inspection of external nose and ears  NECK: no obvious masses on inspection  LUNGS: clear to auscultation bilaterally, no wheezes, rales or rhonchi, good air movement  CV: HRRR, no peripheral edema  MS: moves all extremities without noticeable  abnormality *** PSYCH: pleasant and cooperative, no obvious depression or anxiety  ASSESSMENT AND PLAN:  Discussed the following assessment and plan:  No diagnosis found.  *** -Patient advised to return or notify a doctor immediately if symptoms worsen or persist or new concerns arise.  There are no Patient Instructions on file for this visit.  Terressa KoyanagiHannah R Wilena Tyndall, DO

## 2018-04-11 ENCOUNTER — Ambulatory Visit: Payer: Self-pay | Admitting: Family Medicine

## 2018-04-11 DIAGNOSIS — Z0289 Encounter for other administrative examinations: Secondary | ICD-10-CM

## 2018-11-18 LAB — LIPID PANEL
Cholesterol: 162 (ref 0–200)
Cholesterol: 162 (ref 0–200)
HDL: 50 (ref 35–70)
LDL Cholesterol: 98
Triglycerides: 49 (ref 40–160)

## 2019-04-01 ENCOUNTER — Other Ambulatory Visit: Payer: Self-pay

## 2019-04-01 ENCOUNTER — Ambulatory Visit (INDEPENDENT_AMBULATORY_CARE_PROVIDER_SITE_OTHER): Payer: Self-pay | Admitting: Family Medicine

## 2019-04-01 ENCOUNTER — Encounter: Payer: Self-pay | Admitting: Family Medicine

## 2019-04-01 VITALS — Wt 223.0 lb

## 2019-04-01 DIAGNOSIS — R300 Dysuria: Secondary | ICD-10-CM

## 2019-04-01 MED ORDER — NITROFURANTOIN MONOHYD MACRO 100 MG PO CAPS: 100.0000 mg | ORAL_CAPSULE | Freq: Two times a day (BID) | ORAL | 0 refills | Status: DC

## 2019-04-01 NOTE — Progress Notes (Signed)
Virtual Visit via Video Note  I connected with Debra Wagner  on 04/01/19 at  3:00 PM EDT by a video enabled telemedicine application and verified that I am speaking with the correct person using two identifiers.  Location patient: home Location provider:work or home office Persons participating in the virtual visit: patient, provider  I discussed the limitations of evaluation and management by telemedicine and the availability of in person appointments. The patient expressed understanding and agreed to proceed.   HPI:  Acute visit for a "UTI": -started:the last few days -symptoms include:strong odor, frequency, urgency, dysuria, more tired then usual -denies:hematuria,fever, flank pain, vag symptoms, nausea, vomiting -reports hx of UTI and feels the same to her   ROS: See pertinent positives and negatives per HPI.  Past Medical History:  Diagnosis Date  . GAD (generalized anxiety disorder) 01/19/2015   Sees psychiatrist   . Heart murmur   . High cholesterol   . Migraines 01/19/2015  . Palpitations 01/19/2015   Sees cardiologist   . Seasonal allergies 01/19/2015    History reviewed. No pertinent surgical history.  Family History  Problem Relation Age of Onset  . Heart disease Unknown   . Stroke Unknown   . Hypertension Unknown   . Sudden death Other   . Diabetes Other   . Stroke Mother   . Heart disease Father   . Heart disease Sister     SOCIAL HX: see hpi   Current Outpatient Medications:  .  ALPRAZolam (XANAX) 0.25 MG tablet, as needed. , Disp: , Rfl: 3 .  nitrofurantoin, macrocrystal-monohydrate, (MACROBID) 100 MG capsule, Take 1 capsule (100 mg total) by mouth 2 (two) times daily., Disp: 14 capsule, Rfl: 0  EXAM:  VITALS per patient if applicable: denies fever  GENERAL: alert, oriented, appears well and in no acute distress  HEENT: atraumatic, conjunttiva clear, no obvious abnormalities on inspection of external nose and ears  NECK: normal movements of the head  and neck  LUNGS: on inspection no signs of respiratory distress, breathing rate appears normal, no obvious gross SOB, gasping or wheezing  CV: no obvious cyanosis  MS: moves all visible extremities without noticeable abnormality  PSYCH/NEURO: pleasant and cooperative, no obvious depression or anxiety, speech and thought processing grossly intact  ASSESSMENT AND PLAN:  Discussed the following assessment and plan:  Dysuria -  Discussed potential etiologies, options for evaluation, treatment options, risks and precautions. Opted to treat empirically given the [place] pandemic for presumed UTI. Macrobid 100mg  bid x 5-7 days. She agrees to follow up if worseneing, new concerns or if symptoms to not resolve with treatment. Due for CPE. She reports she will call back to schedule once has insurance. Wants to stick with me and Dr. Ethlyn Gallery for her care.     I discussed the assessment and treatment plan with the patient. The patient was provided an opportunity to ask questions and all were answered. The patient agreed with the plan and demonstrated an understanding of the instructions.   The patient was advised to call back or seek an in-person evaluation if the symptoms worsen or if the condition fails to improve as anticipated.   Debra Kern, DO

## 2019-07-02 ENCOUNTER — Ambulatory Visit: Payer: Self-pay

## 2019-07-05 ENCOUNTER — Other Ambulatory Visit: Payer: Self-pay

## 2019-07-05 ENCOUNTER — Ambulatory Visit (INDEPENDENT_AMBULATORY_CARE_PROVIDER_SITE_OTHER): Payer: Self-pay

## 2019-07-05 DIAGNOSIS — Z23 Encounter for immunization: Secondary | ICD-10-CM

## 2019-07-07 ENCOUNTER — Encounter: Payer: Self-pay | Admitting: Family Medicine

## 2019-07-23 ENCOUNTER — Encounter: Payer: Self-pay | Admitting: Family Medicine

## 2019-07-23 ENCOUNTER — Ambulatory Visit (INDEPENDENT_AMBULATORY_CARE_PROVIDER_SITE_OTHER): Payer: Self-pay | Admitting: Family Medicine

## 2019-07-23 ENCOUNTER — Other Ambulatory Visit: Payer: Self-pay

## 2019-07-23 DIAGNOSIS — R030 Elevated blood-pressure reading, without diagnosis of hypertension: Secondary | ICD-10-CM

## 2019-07-23 DIAGNOSIS — F411 Generalized anxiety disorder: Secondary | ICD-10-CM

## 2019-07-23 MED ORDER — ALPRAZOLAM 0.25 MG PO TABS: 0.2500 mg | ORAL_TABLET | Freq: Every day | ORAL | 0 refills | Status: DC | PRN

## 2019-07-23 NOTE — Progress Notes (Signed)
Virtual Visit via Video Note  I connected with Debra Wagner   on 07/23/19 at  2:30 PM EDT by a video enabled telemedicine application and verified that I am speaking with the correct person using two identifiers.  Location patient: home Location provider:work office Persons participating in the virtual visit: patient, provider  I discussed the limitations of evaluation and management by telemedicine and the availability of in person appointments. The patient expressed understanding and agreed to proceed.   Debra Wagner DOB: July 22, 1971 Encounter date: 07/23/2019  This is a 48 y.o. female who presents to establish care. Chief Complaint  Patient presents with  . Establish Care    History of present illness: Is worried about her blood pressure. Had eye exam about a week and a half ago. When getting eyes checked there was change in artery since last visit 3 years ago. BP was 150/100; recheck was 140/85. Just checked bp 120/59; second reading was 121/45. Feels fine today. Weight today is 219. Has lost 10 pounds in last month.  Walking 2-3 miles/day.   Anxiety: Took sertraline in 2017 for generalized anxiety. Hard time losing weight that she gained with taking this. Doing weight watchers. Getting up every morning and walking 2-3 miles. Has done weight watchers in past - lost 25lb in past.   Drinking up to a gallon of water a day. Has noted some reflux in last couple weeks. Notes if she eats something and then lays down. Has had issues with this in the past.   Migraine: Has not had a "migraine" but has headaches that only zyrtec  Will take away. Felt that headaches are allergy triggered.   Palpitations:has seen cardiology for years for this. He checked bp in office and it was 130/75 and recommended continuing with exercise, diet. Not having palpitations any longer. Still has bouts of feeling anxious at time. Has had anxiety since age of 89.   Sometimes has dizziness but this is when it is time  for menses or with severe anxiety. Not alarming for her.   HL: diet controlled.    Past Medical History:  Diagnosis Date  . GAD (generalized anxiety disorder) 01/19/2015   Sees psychiatrist   . Heart murmur   . High cholesterol   . Migraines 01/19/2015  . Palpitations 01/19/2015   Sees cardiologist   . Seasonal allergies 01/19/2015   History reviewed. No pertinent surgical history. Allergies  Allergen Reactions  . Sulfa Antibiotics    Current Meds  Medication Sig  . ALPRAZolam (XANAX) 0.25 MG tablet Take 1 tablet (0.25 mg total) by mouth daily as needed for anxiety.  . [DISCONTINUED] ALPRAZolam (XANAX) 0.25 MG tablet as needed.   . [DISCONTINUED] nitrofurantoin, macrocrystal-monohydrate, (MACROBID) 100 MG capsule Take 1 capsule (100 mg total) by mouth 2 (two) times daily.   Social History   Tobacco Use  . Smoking status: Never Smoker  . Smokeless tobacco: Never Used  Substance Use Topics  . Alcohol use: No   Family History  Problem Relation Age of Onset  . Heart disease Other   . Stroke Other   . Hypertension Other   . Sudden death Other   . Diabetes Other   . Stroke Mother 83  . High Cholesterol Mother   . CAD Mother   . Heart disease Father   . Heart attack Father 9  . High Cholesterol Father   . Heart disease Sister   . Heart attack Sister 67  . Heart failure Sister   .  Diabetes Sister   . Obesity Sister   . High blood pressure Brother   . Alzheimer's disease Maternal Grandmother   . Heart disease Paternal Grandmother   . Healthy Sister   . Healthy Brother   . Healthy Brother   . Alzheimer's disease Maternal Aunt      Review of Systems  Constitutional: Negative for chills, fatigue and fever.  Respiratory: Negative for cough, chest tightness, shortness of breath and wheezing.   Cardiovascular: Negative for chest pain, palpitations and leg swelling.    Objective:  LMP 07/23/2019 (Exact Date)       BP Readings from Last 3 Encounters:  12/10/17  110/72  10/25/17 110/82  10/12/17 118/72   Wt Readings from Last 3 Encounters:  04/01/19 223 lb (101.2 kg)  12/10/17 222 lb 3.2 oz (100.8 kg)  10/25/17 221 lb 4.8 oz (100.4 kg)    EXAM:  GENERAL: alert, oriented, appears well and in no acute distress  HEENT: atraumatic, conjunctiva clear, no obvious abnormalities on inspection of external nose and ears  NECK: normal movements of the head and neck  LUNGS: on inspection no signs of respiratory distress, breathing rate appears normal, no obvious gross SOB, gasping or wheezing  CV: no obvious cyanosis  MS: moves all visible extremities without noticeable abnormality  PSYCH/NEURO: pleasant and cooperative, no obvious depression or anxiety, speech and thought processing grossly intact  SKIN: no facial or neck abnormalities.   Assessment/Plan  1. GAD (generalized anxiety disorder) Well-controlled.  Rare use of Xanax.  Just likes to have on hand in case she is having more severe anxiety. - ALPRAZolam (XANAX) 0.25 MG tablet; Take 1 tablet (0.25 mg total) by mouth daily as needed for anxiety.  Dispense: 30 tablet; Refill: 0  2. Morbid Obesity: She has been doing a great job following weight watchers and exercising on a daily basis.  Keep this up.  We discussed that eating and regular exercise are biggest factors for her that will help with overall health risks.  3. Elevated bp reading: She has a strong family history of cardiovascular issues.  I have advised her to monitor her blood pressure and we will have her bring her cuff and check against ours in the office next time she is in for her visit.  Let us know if blood pressure seem to be running higher in the meanwhile.  We did also discuss due to family history we may want to consider other screening interventions like coronary calcium scoring.  I encouraged her to discuss this with cardiology at follow-up visit, but we can discuss in the future as well.    Return for pending  bloodwork review from cardiology.  She had recent blood work with cardiology.  We discussed considering additional blood work pending lactate ordered.  I think it be reasonable to get sed rate and CRP due to vascular changes that are noted in the eye on her eye exam.  Is also important for [place] to make sure that cholesterol is well controlled for her.  Additionally had like to make sure we have checked kidney function since she has had some labile blood pressures.    I discussed the assessment and treatment plan with the patient. The patient was provided an opportunity to ask questions and all were answered. The patient agreed with the plan and demonstrated an understanding of the instructions.   The patient was advised to call back or seek an in-person evaluation if the symptoms worsen or if the  condition fails to improve as anticipated.  I provided 25 minutes of non-face-to-face time during this encounter.   Theodis ShoveJunell Koberlein, MD

## 2019-08-02 ENCOUNTER — Encounter: Payer: Self-pay | Admitting: Family Medicine

## 2019-08-03 ENCOUNTER — Other Ambulatory Visit: Payer: Self-pay | Admitting: Family Medicine

## 2019-08-03 DIAGNOSIS — H35019 Changes in retinal vascular appearance, unspecified eye: Secondary | ICD-10-CM

## 2019-08-03 DIAGNOSIS — R030 Elevated blood-pressure reading, without diagnosis of hypertension: Secondary | ICD-10-CM

## 2019-08-03 DIAGNOSIS — R002 Palpitations: Secondary | ICD-10-CM

## 2019-08-03 DIAGNOSIS — Z833 Family history of diabetes mellitus: Secondary | ICD-10-CM

## 2019-08-03 DIAGNOSIS — E559 Vitamin D deficiency, unspecified: Secondary | ICD-10-CM

## 2019-08-05 NOTE — Telephone Encounter (Signed)
I called the pt and scheduled a lab appt on 11/10.

## 2019-08-11 ENCOUNTER — Telehealth: Payer: Self-pay | Admitting: *Deleted

## 2019-08-11 DIAGNOSIS — R300 Dysuria: Secondary | ICD-10-CM

## 2019-08-11 NOTE — Addendum Note (Signed)
Addended by: Agnes Lawrence on: 08/11/2019 03:18 PM   Modules accepted: Orders

## 2019-08-11 NOTE — Telephone Encounter (Signed)
Patient returning call to [facility]. Denies fever, flank pain, or vomiting.

## 2019-08-11 NOTE — Telephone Encounter (Signed)
Ok to add on UA and culture. Make sure no fever, flank pain, vomiting.

## 2019-08-11 NOTE — Telephone Encounter (Signed)
Left a message for the pt to return a call to the office.  Orders entered and CRM also created.

## 2019-08-11 NOTE — Telephone Encounter (Signed)
Copied from [place] [id number]. Topic: General - Other >> Aug 11, 2019 11:52 AM Carolyn Stare wrote: Pt said she having symptons of a UTI and has a lab scheduled for 08/12/2019 and is asking if she can do a urinalysis

## 2019-08-12 ENCOUNTER — Other Ambulatory Visit: Payer: Self-pay

## 2019-08-12 NOTE — Telephone Encounter (Signed)
Looks like urine studies were ordered

## 2019-08-13 ENCOUNTER — Other Ambulatory Visit (INDEPENDENT_AMBULATORY_CARE_PROVIDER_SITE_OTHER): Payer: Self-pay

## 2019-08-13 ENCOUNTER — Other Ambulatory Visit: Payer: Self-pay

## 2019-08-13 ENCOUNTER — Other Ambulatory Visit: Payer: Self-pay | Admitting: Family Medicine

## 2019-08-13 DIAGNOSIS — Z833 Family history of diabetes mellitus: Secondary | ICD-10-CM

## 2019-08-13 DIAGNOSIS — H35019 Changes in retinal vascular appearance, unspecified eye: Secondary | ICD-10-CM

## 2019-08-13 DIAGNOSIS — R002 Palpitations: Secondary | ICD-10-CM

## 2019-08-13 DIAGNOSIS — R030 Elevated blood-pressure reading, without diagnosis of hypertension: Secondary | ICD-10-CM

## 2019-08-13 DIAGNOSIS — R300 Dysuria: Secondary | ICD-10-CM

## 2019-08-13 DIAGNOSIS — E559 Vitamin D deficiency, unspecified: Secondary | ICD-10-CM

## 2019-08-13 LAB — CBC WITH DIFFERENTIAL/PLATELET
Basophils Absolute: 0 10*3/uL (ref 0.0–0.1)
Basophils Relative: 0.6 % (ref 0.0–3.0)
Eosinophils Absolute: 0.1 10*3/uL (ref 0.0–0.7)
Eosinophils Relative: 1.8 % (ref 0.0–5.0)
HCT: 38 % (ref 36.0–46.0)
Hemoglobin: 12.3 g/dL (ref 12.0–15.0)
Lymphocytes Relative: 36 % (ref 12.0–46.0)
Lymphs Abs: 2 10*3/uL (ref 0.7–4.0)
MCHC: 32.4 g/dL (ref 30.0–36.0)
MCV: 79.1 fl (ref 78.0–100.0)
Monocytes Absolute: 0.6 10*3/uL (ref 0.1–1.0)
Monocytes Relative: 11.5 % (ref 3.0–12.0)
Neutro Abs: 2.8 10*3/uL (ref 1.4–7.7)
Neutrophils Relative %: 50.1 % (ref 43.0–77.0)
Platelets: 243 10*3/uL (ref 150.0–400.0)
RBC: 4.81 Mil/uL (ref 3.87–5.11)
RDW: 16.1 % — ABNORMAL HIGH (ref 11.5–15.5)
WBC: 5.6 10*3/uL (ref 4.0–10.5)

## 2019-08-13 LAB — COMPREHENSIVE METABOLIC PANEL
ALT: 10 U/L (ref 0–35)
AST: 13 U/L (ref 0–37)
Albumin: 3.9 g/dL (ref 3.5–5.2)
Alkaline Phosphatase: 80 U/L (ref 39–117)
BUN: 8 mg/dL (ref 6–23)
CO2: 25 mEq/L (ref 19–32)
Calcium: 8.6 mg/dL (ref 8.4–10.5)
Chloride: 106 mEq/L (ref 96–112)
Creatinine, Ser: 0.75 mg/dL (ref 0.40–1.20)
GFR: 100.39 mL/min (ref >60.00)
Glucose, Bld: 109 mg/dL — ABNORMAL HIGH (ref 70–99)
Potassium: 3.8 mEq/L (ref 3.5–5.1)
Sodium: 137 mEq/L (ref 135–145)
Total Bilirubin: 0.5 mg/dL (ref 0.2–1.2)
Total Protein: 7 g/dL (ref 6.0–8.3)

## 2019-08-13 LAB — POC URINALSYSI DIPSTICK (AUTOMATED)
Glucose, UA: NEGATIVE
Protein, UA: POSITIVE — AB
Spec Grav, UA: 1.025 (ref 1.010–1.025)
Urobilinogen, UA: 1 E.U./dL
pH, UA: 6 (ref 5.0–8.0)

## 2019-08-13 LAB — TSH: TSH: 2.71 u[IU]/mL (ref 0.35–4.50)

## 2019-08-13 LAB — C-REACTIVE PROTEIN: CRP: <1 mg/dL (ref 0.5–20.0)

## 2019-08-13 LAB — SEDIMENTATION RATE: Sed Rate: 27 mm/hr — ABNORMAL HIGH (ref 0–20)

## 2019-08-13 LAB — HEMOGLOBIN A1C: Hgb A1c MFr Bld: 5.9 % (ref 4.6–6.5)

## 2019-08-13 LAB — VITAMIN D 25 HYDROXY (VIT D DEFICIENCY, FRACTURES): VITD: 20.13 ng/mL — ABNORMAL LOW (ref 30.00–100.00)

## 2019-08-13 MED ORDER — NITROFURANTOIN MONOHYD MACRO 100 MG PO CAPS: 100.0000 mg | ORAL_CAPSULE | Freq: Two times a day (BID) | ORAL | 0 refills | Status: DC

## 2019-08-15 LAB — URINE CULTURE
MICRO NUMBER:: 1089296
SPECIMEN QUALITY:: ADEQUATE

## 2019-08-15 LAB — ANA: Anti Nuclear Antibody (ANA): NEGATIVE

## 2019-08-18 ENCOUNTER — Telehealth: Payer: Self-pay | Admitting: Family Medicine

## 2019-08-18 DIAGNOSIS — R300 Dysuria: Secondary | ICD-10-CM

## 2019-08-18 NOTE — Telephone Encounter (Signed)
nitrofurantoin, macrocrystal-monohydrate, (MACROBID) 100 MG capsule  Was given the script on the 12th took 2 pills and got sick with chills and nauesa for 24 hrs and she stopped taking this med as she thought it was the med.She was feeling better within 24 hrs but the UTI is still there and was wanting a different script pls FU with pt  [phone]

## 2019-08-18 NOTE — Telephone Encounter (Signed)
Make sure no other symptoms like vaginal discharge, abdominal pain. If so, we need to see her in the office.   I don't see where we have given her antibiotics in past, but because of UA I am ok with giving keflex 500mg  BID x 5 days. If she still has symptoms after this we need to do an in office visit to assess. If anything gets worse during treatment, we also need to have her seen.   Ok to send in if she is agreeable to trying med.

## 2019-08-18 NOTE — Telephone Encounter (Signed)
Patient informed of the message below.  Denies any vaginal discharge or abdominal pain nor urinary symptoms.  Patient states she is hesitant to take any additional antibiotics at this point and questioned if there are any other alternatives for her?  Message sent to Dr Ethlyn Gallery.

## 2019-08-19 NOTE — Telephone Encounter (Signed)
Patient informed of the message below.  Patient stated she is feeling OK, declined a virtual visit with Dr Maudie Mercury today and requested a visit with Dr Ethlyn Gallery instead.  Also stated she had vaginal exam with her GYN a few weeks ago and was not having any urinary symptoms at the time of that appt.  Message sent to Dr Ethlyn Gallery for scheduling info.

## 2019-08-19 NOTE — Telephone Encounter (Signed)
I think it would be reasonable for her to have in an in office visit with vaginal exam and repeat UA. Her urinary sx were never discussed, just reported and we added on UA to bloodwork. If HK has opening she could do virtual today to get better idea of current sx and help triage?

## 2019-08-20 NOTE — Telephone Encounter (Signed)
Patient informed of the message below.  Lab appt scheduled for 11/23.

## 2019-08-20 NOTE — Telephone Encounter (Signed)
Well if she is not having current sx; why don't we just repeat UA for her and see how it looks? Then I can call her with results and talk through these.

## 2019-08-22 ENCOUNTER — Other Ambulatory Visit: Payer: Self-pay | Admitting: Obstetrics & Gynecology

## 2019-08-25 ENCOUNTER — Other Ambulatory Visit: Payer: Self-pay

## 2019-08-26 ENCOUNTER — Other Ambulatory Visit (INDEPENDENT_AMBULATORY_CARE_PROVIDER_SITE_OTHER): Payer: Self-pay

## 2019-08-26 DIAGNOSIS — R300 Dysuria: Secondary | ICD-10-CM

## 2019-08-26 LAB — POC URINALSYSI DIPSTICK (AUTOMATED)
Glucose, UA: NEGATIVE
Leukocytes, UA: NEGATIVE
Protein, UA: NEGATIVE
Spec Grav, UA: >=1.03 — AB (ref 1.010–1.025)
Urobilinogen, UA: 0.2 E.U./dL
pH, UA: 6 (ref 5.0–8.0)

## 2019-11-06 ENCOUNTER — Encounter: Payer: Self-pay | Admitting: Family Medicine

## 2019-11-28 ENCOUNTER — Ambulatory Visit: Payer: Self-pay | Attending: Internal Medicine

## 2019-11-28 DIAGNOSIS — Z23 Encounter for immunization: Secondary | ICD-10-CM | POA: Insufficient documentation

## 2019-12-23 ENCOUNTER — Ambulatory Visit: Payer: Self-pay | Attending: Internal Medicine

## 2019-12-23 DIAGNOSIS — Z23 Encounter for immunization: Secondary | ICD-10-CM

## 2019-12-23 NOTE — Progress Notes (Signed)
   Covid-19 Vaccination Clinic  Name:  Debra Wagner    MRN: [id number] DOB: 08-18-1971  12/23/2019  Debra Wagner was observed post Covid-19 immunization for 15 minutes without incident. She was provided with Vaccine Information Sheet and instruction to access the V-Safe system.   Debra Wagner was instructed to call 911 with any severe reactions post vaccine: Marland Kitchen Difficulty breathing  . Swelling of face and throat  . A fast heartbeat  . A bad rash all over body  . Dizziness and weakness   Immunizations Administered    Name Date Dose VIS Date Route   Pfizer COVID-19 Vaccine 12/23/2019 11:03 AM 0.3 mL 09/12/2019 Intramuscular   Manufacturer: [employer], [employer]   Lot: [id number]   NDC: [address]-8

## 2020-05-10 ENCOUNTER — Other Ambulatory Visit: Payer: Self-pay

## 2020-05-10 ENCOUNTER — Encounter: Payer: Self-pay | Admitting: Family Medicine

## 2020-05-10 ENCOUNTER — Ambulatory Visit (INDEPENDENT_AMBULATORY_CARE_PROVIDER_SITE_OTHER): Payer: Self-pay | Admitting: Family Medicine

## 2020-05-10 VITALS — BP 136/74 | HR 93 | Temp 98.5°F | Wt 225.6 lb

## 2020-05-10 DIAGNOSIS — H9203 Otalgia, bilateral: Secondary | ICD-10-CM

## 2020-05-10 NOTE — Patient Instructions (Signed)
Consider trial of over the counter Flonase (in addition to the Zyrtec).  Could add low dose sudafed to the above if still congested- but caution that sudafed can cause insomnia.

## 2020-05-10 NOTE — Progress Notes (Signed)
Established Patient Office Visit  Subjective:  Patient ID: Debra Wagner, female    DOB: [birth year]  Age: 49 y.o. MRN: [id number]  CC:  Chief Complaint  Patient presents with  . Ear Pain    in both ears since thursday along with headache     HPI Debra Wagner presents for bilateral ear pain since last Thursday.  No recent flying or significant altitude change.  She does have history of some perennial allergies.  Occasionally takes Zyrtec but not regularly.  Denies any fever or chills.  No hearing loss.  No vertigo.  She does have nasal congestion intermittently with some sneezing.  No purulent secretions.  Intermittent headaches.  No ear drainage  Past Medical History:  Diagnosis Date  . GAD (generalized anxiety disorder) 01/19/2015   Sees psychiatrist   . Heart murmur   . High cholesterol   . Migraines 01/19/2015  . Palpitations 01/19/2015   Sees cardiologist   . Seasonal allergies 01/19/2015    No past surgical history on file.  Family History  Problem Relation Age of Onset  . Heart disease Other   . Stroke Other   . Hypertension Other   . Sudden death Other   . Diabetes Other   . Stroke Mother 8  . High Cholesterol Mother   . CAD Mother   . Heart disease Father   . Heart attack Father 59  . High Cholesterol Father   . Heart disease Sister   . Heart attack Sister 20  . Heart failure Sister   . Diabetes Sister   . Obesity Sister   . High blood pressure Brother   . Alzheimer's disease Maternal Grandmother   . Heart disease Paternal Grandmother   . Healthy Sister   . Healthy Brother   . Healthy Brother   . Alzheimer's disease Maternal Aunt     Social History   Socioeconomic History  . Marital status: Single    Spouse name: Not on file  . Number of children: Not on file  . Years of education: Not on file  . Highest education level: Not on file  Occupational History  . Not on file  Tobacco Use  . Smoking status: Never Smoker  . Smokeless tobacco: Never  Used  Substance and Sexual Activity  . Alcohol use: No  . Drug use: Not on file  . Sexual activity: Not on file  Other Topics Concern  . 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 Difficulty of Paying Living Expenses:   Food Insecurity:   . Worried About Programme researcher, broadcasting/film/video in the Last Year:   . Barista in the Last Year:   Transportation Needs:   . Freight forwarder (Medical):   Marland Kitchen Lack of Transportation (Non-Medical):   Physical Activity:   . Days of Exercise per Week:   . Minutes of Exercise per Session:   Stress:   . Feeling of Stress :   Social Connections:   . Frequency of Communication with Friends and Family:   . Frequency of Social Gatherings with Friends and Family:   . Attends Religious Services:   .  Active Member of Clubs or Organizations:   . Attends Banker Meetings:   Marland Kitchen Marital Status:   Intimate Partner Violence:   . Fear of Current or Ex-Partner:   . Emotionally Abused:   Marland Kitchen Physically Abused:   . Sexually Abused:     Outpatient Medications Prior to Visit  Medication Sig Dispense Refill  . ALPRAZolam (XANAX) 0.25 MG tablet Take 1 tablet (0.25 mg total) by mouth daily as needed for anxiety. 30 tablet 0  . nitrofurantoin, macrocrystal-monohydrate, (MACROBID) 100 MG capsule Take 1 capsule (100 mg total) by mouth 2 (two) times daily. 14 capsule 0   No facility-administered medications prior to visit.    Allergies  Allergen Reactions  . Sulfa Antibiotics     ROS Review of Systems  Constitutional: Negative for chills and fever.  HENT: Positive for ear pain. Negative for ear discharge, facial swelling, hearing loss and sore throat.   Neurological: Positive for headaches.      Objective:      Physical Exam Vitals reviewed.  Constitutional:      Appearance: Normal appearance.  HENT:     Right Ear: Tympanic membrane and ear canal normal.     Left Ear: Tympanic membrane and ear canal normal.  Cardiovascular:     Rate and Rhythm: Normal rate and regular rhythm.  Pulmonary:     Effort: Pulmonary effort is normal.     Breath sounds: Normal breath sounds.  Neurological:     Mental Status: She is alert.     BP 136/74 (BP Location: Left Arm, Patient Position: Sitting, Cuff Size: Normal)   Pulse 93   Temp 98.5 F (36.9 C) (Oral)   Wt 225 lb 9.6 oz (102.3 kg)   SpO2 97%   BMI 39.96 kg/m  Wt Readings from Last 3 Encounters:  05/10/20 225 lb 9.6 oz (102.3 kg)  04/01/19 223 lb (101.2 kg)  12/10/17 222 lb 3.2 oz (100.8 kg)     Health Maintenance Due  Topic Date Due  . Hepatitis C Screening  Never done  . PAP SMEAR-Modifier  02/01/2020  . INFLUENZA VACCINE  05/02/2020    There are no preventive care reminders to display for this patient.  Lab Results  Component Value Date   TSH 2.71 08/13/2019   Lab Results  Component Value Date   WBC 5.6 08/13/2019   HGB 12.3 08/13/2019   HCT 38.0 08/13/2019   MCV 79.1 08/13/2019   PLT 243.0 08/13/2019   Lab Results  Component Value Date   NA 137 08/13/2019   K 3.8 08/13/2019   CO2 25 08/13/2019   GLUCOSE 109 (H) 08/13/2019   BUN 8 08/13/2019   CREATININE 0.75 08/13/2019   BILITOT 0.5 08/13/2019   ALKPHOS 80 08/13/2019   AST 13 08/13/2019   ALT 10 08/13/2019   PROT 7.0 08/13/2019   ALBUMIN 3.9 08/13/2019   CALCIUM 8.6 08/13/2019   GFR 100.39 08/13/2019   Lab Results  Component Value Date   CHOL 162 11/18/2018   CHOL 162 11/18/2018   Lab Results  Component Value Date   HDL 50 11/18/2018   Lab Results  Component Value Date   LDLCALC 98 11/18/2018   Lab Results  Component Value Date   TRIG 49 11/18/2018   Lab Results  Component Value Date   CHOLHDL 4 12/10/2017   Lab Results  Component Value Date    HGBA1C 5.9 08/13/2019      Assessment & Plan:   Bilateral otalgia.  Normal exam.  No evidence for otitis media/effusion .  question eustachian tube dysfunction.  This may be related to some perennial allergies  -We recommend regular daily use of Zyrtec 10 mg -Recommend addition of Flonase nasal daily -Consider short-term as needed use of Sudafed but cautioned about possible side effects -Follow-up with primary if the above are not effective  No orders of the defined types were placed in this encounter.   Follow-up: No follow-ups on file.    Evelena Peat, MD

## 2020-07-07 ENCOUNTER — Other Ambulatory Visit: Payer: Self-pay | Admitting: Obstetrics & Gynecology

## 2020-07-07 DIAGNOSIS — Z1231 Encounter for screening mammogram for malignant neoplasm of breast: Secondary | ICD-10-CM

## 2020-07-12 ENCOUNTER — Ambulatory Visit
Admission: RE | Admit: 2020-07-12 | Discharge: 2020-07-12 | Disposition: A | Discharge status: Home / Self Care | Payer: No Typology Code available for payment source | Source: Ambulatory Visit | Attending: Obstetrics & Gynecology | Admitting: Obstetrics & Gynecology

## 2020-07-12 ENCOUNTER — Other Ambulatory Visit: Payer: Self-pay

## 2020-07-12 DIAGNOSIS — Z1231 Encounter for screening mammogram for malignant neoplasm of breast: Secondary | ICD-10-CM

## 2020-07-16 ENCOUNTER — Other Ambulatory Visit: Payer: Self-pay | Admitting: Obstetrics & Gynecology

## 2020-07-16 DIAGNOSIS — R928 Other abnormal and inconclusive findings on diagnostic imaging of breast: Secondary | ICD-10-CM

## 2020-07-29 ENCOUNTER — Other Ambulatory Visit: Payer: Self-pay

## 2020-07-29 ENCOUNTER — Other Ambulatory Visit: Payer: Self-pay | Admitting: Obstetrics & Gynecology

## 2020-07-29 ENCOUNTER — Ambulatory Visit
Admission: RE | Admit: 2020-07-29 | Discharge: 2020-07-29 | Disposition: A | Discharge status: Home / Self Care | Payer: No Typology Code available for payment source | Source: Ambulatory Visit | Attending: Obstetrics & Gynecology | Admitting: Obstetrics & Gynecology

## 2020-07-29 DIAGNOSIS — N6489 Other specified disorders of breast: Secondary | ICD-10-CM

## 2020-07-29 DIAGNOSIS — R928 Other abnormal and inconclusive findings on diagnostic imaging of breast: Secondary | ICD-10-CM

## 2020-08-03 ENCOUNTER — Ambulatory Visit: Payer: Self-pay | Admitting: *Deleted

## 2020-08-03 ENCOUNTER — Other Ambulatory Visit: Payer: Self-pay

## 2020-08-03 VITALS — BP 154/94 | Temp 97.8°F | Wt 225.6 lb

## 2020-08-03 DIAGNOSIS — Z1239 Encounter for other screening for malignant neoplasm of breast: Secondary | ICD-10-CM

## 2020-08-03 NOTE — Progress Notes (Addendum)
[name] is a 49 y.o. female who presents to [facility] clinic today with referral from the [facility] due to a left breast biopsy is recommended. Screening mammogram completed 07/12/2020 that additional imaging of the left breast for possible distortion was recommended. Left breast diagnostic mammogram and ultrasound completed 07/29/2020 that a stereotatic tomosynthesis core needle biopsy of the left breast is recommended.   Pap Smear: Pap smear not completed today. Last Pap smear was 01/31/2017 at [facility] clinic and was normal with negative HPV. Per patient has no history of an abnormal Pap smear. Last Pap smear result is available in Epic.   Physical exam: Breasts Breasts symmetrical. No skin abnormalities bilateral breasts. No nipple retraction bilateral breasts. No nipple discharge bilateral breasts. No lymphadenopathy. No lumps palpated bilateral breasts. No complaints of pain or tenderness on exam.       Pelvic/Bimanual Pap is not indicated today per B[facility]CP guidelines.   Smoking History: Patient has never smoked.   Patient Navigation: Patient education provided. Access to services provided for patient through B[facility]CP program.    Breast and Cervical Cancer Risk Assessment: Patient does not have family history of breast cancer, known genetic mutations, or radiation treatment to the chest before age 37. Patient does not have history of cervical dysplasia, immunocompromised, or DES exposure in-utero.  Risk Assessment    Risk Scores      08/03/2020   Last edited by: Narda Rutherford, LPN   5-year risk: 1 %   Lifetime risk: 9 %          A: B[facility]CP exam without pap smear No complaints. Referred to B[facility]CP for left breast biopsy.  P: Referred patient to the [facility] for a left breast biopsy per recommendation. Appointment scheduled Wednesday, August 10, 2020 at 0930.  Priscille Heidelberg, RN 08/03/2020 9:32 AM

## 2020-08-03 NOTE — Patient Instructions (Signed)
Explained breast self awareness with Debra Wagner. Patient did not need a Pap smear today due to last Pap smear and HPV typing was 01/31/2017. Let her know B[facility]CP will cover Pap smears and HPV typing every 5 years unless has a history of abnormal Pap smears. Referred patient to the [facility] for a left breast biopsy per recommendation. Appointment scheduled Wednesday, August 10, 2020 at 0930. Patient aware of appointment and will be there. Debra Wagner verbalized understanding.  Shikita Vaillancourt, Kathaleen Maser, RN 9:32 AM

## 2020-08-11 ENCOUNTER — Other Ambulatory Visit: Payer: Self-pay | Admitting: Obstetrics & Gynecology

## 2020-08-11 ENCOUNTER — Ambulatory Visit
Admission: RE | Admit: 2020-08-11 | Discharge: 2020-08-11 | Disposition: A | Discharge status: Home / Self Care | Payer: No Typology Code available for payment source | Source: Ambulatory Visit | Attending: Obstetrics & Gynecology | Admitting: Obstetrics & Gynecology

## 2020-08-11 ENCOUNTER — Ambulatory Visit: Payer: Self-pay | Admitting: Family Medicine

## 2020-08-11 ENCOUNTER — Other Ambulatory Visit: Payer: Self-pay

## 2020-08-11 DIAGNOSIS — N6489 Other specified disorders of breast: Secondary | ICD-10-CM

## 2020-11-01 ENCOUNTER — Ambulatory Visit (INDEPENDENT_AMBULATORY_CARE_PROVIDER_SITE_OTHER): Payer: 59 | Admitting: Family Medicine

## 2020-11-01 ENCOUNTER — Other Ambulatory Visit: Payer: Self-pay

## 2020-11-01 ENCOUNTER — Encounter: Payer: Self-pay | Admitting: Family Medicine

## 2020-11-01 VITALS — BP 144/90 | HR 84 | Temp 98.4°F | Ht 62.5 in | Wt 232.6 lb

## 2020-11-01 DIAGNOSIS — R5383 Other fatigue: Secondary | ICD-10-CM | POA: Diagnosis not present

## 2020-11-01 DIAGNOSIS — Z0000 Encounter for general adult medical examination without abnormal findings: Secondary | ICD-10-CM

## 2020-11-01 DIAGNOSIS — E559 Vitamin D deficiency, unspecified: Secondary | ICD-10-CM

## 2020-11-01 DIAGNOSIS — E538 Deficiency of other specified B group vitamins: Secondary | ICD-10-CM | POA: Diagnosis not present

## 2020-11-01 DIAGNOSIS — H9203 Otalgia, bilateral: Secondary | ICD-10-CM

## 2020-11-01 DIAGNOSIS — F411 Generalized anxiety disorder: Secondary | ICD-10-CM

## 2020-11-01 DIAGNOSIS — Z13220 Encounter for screening for lipoid disorders: Secondary | ICD-10-CM | POA: Diagnosis not present

## 2020-11-01 DIAGNOSIS — G43809 Other migraine, not intractable, without status migrainosus: Secondary | ICD-10-CM

## 2020-11-01 DIAGNOSIS — J302 Other seasonal allergic rhinitis: Secondary | ICD-10-CM

## 2020-11-01 DIAGNOSIS — R030 Elevated blood-pressure reading, without diagnosis of hypertension: Secondary | ICD-10-CM

## 2020-11-01 DIAGNOSIS — R7309 Other abnormal glucose: Secondary | ICD-10-CM | POA: Diagnosis not present

## 2020-11-01 DIAGNOSIS — G5602 Carpal tunnel syndrome, left upper limb: Secondary | ICD-10-CM | POA: Diagnosis not present

## 2020-11-01 LAB — IBC PANEL
Iron: 44 ug/dL (ref 42–145)
Saturation Ratios: 10.4 % — ABNORMAL LOW (ref 20.0–50.0)
Transferrin: 302 mg/dL (ref 212.0–360.0)

## 2020-11-01 LAB — LIPID PANEL
Cholesterol: 192 mg/dL (ref 0–200)
HDL: 47 mg/dL (ref >39.00)
LDL Cholesterol: 134 mg/dL — ABNORMAL HIGH (ref 0–99)
NonHDL: 144.5
Total CHOL/HDL Ratio: 4
Triglycerides: 54 mg/dL (ref 0.0–149.0)
VLDL: 10.8 mg/dL (ref 0.0–40.0)

## 2020-11-01 LAB — CBC WITH DIFFERENTIAL/PLATELET
Basophils Absolute: 0.1 10*3/uL (ref 0.0–0.1)
Basophils Relative: 1.1 % (ref 0.0–3.0)
Eosinophils Absolute: 0.1 10*3/uL (ref 0.0–0.7)
Eosinophils Relative: 2.4 % (ref 0.0–5.0)
HCT: 40.1 % (ref 36.0–46.0)
Hemoglobin: 13 g/dL (ref 12.0–15.0)
Lymphocytes Relative: 33.1 % (ref 12.0–46.0)
Lymphs Abs: 1.8 10*3/uL (ref 0.7–4.0)
MCHC: 32.4 g/dL (ref 30.0–36.0)
MCV: 78.3 fl (ref 78.0–100.0)
Monocytes Absolute: 0.6 10*3/uL (ref 0.1–1.0)
Monocytes Relative: 10.8 % (ref 3.0–12.0)
Neutro Abs: 2.8 10*3/uL (ref 1.4–7.7)
Neutrophils Relative %: 52.6 % (ref 43.0–77.0)
Platelets: 303 10*3/uL (ref 150.0–400.0)
RBC: 5.13 Mil/uL — ABNORMAL HIGH (ref 3.87–5.11)
RDW: 15.7 % — ABNORMAL HIGH (ref 11.5–15.5)
WBC: 5.3 10*3/uL (ref 4.0–10.5)

## 2020-11-01 LAB — COMPREHENSIVE METABOLIC PANEL
ALT: 14 U/L (ref 0–35)
AST: 17 U/L (ref 0–37)
Albumin: 4.4 g/dL (ref 3.5–5.2)
Alkaline Phosphatase: 88 U/L (ref 39–117)
BUN: 14 mg/dL (ref 6–23)
CO2: 27 mEq/L (ref 19–32)
Calcium: 9.5 mg/dL (ref 8.4–10.5)
Chloride: 103 mEq/L (ref 96–112)
Creatinine, Ser: 0.74 mg/dL (ref 0.40–1.20)
GFR: 96.07 mL/min (ref >60.00)
Glucose, Bld: 95 mg/dL (ref 70–99)
Potassium: 4 mEq/L (ref 3.5–5.1)
Sodium: 137 mEq/L (ref 135–145)
Total Bilirubin: 0.4 mg/dL (ref 0.2–1.2)
Total Protein: 7.9 g/dL (ref 6.0–8.3)

## 2020-11-01 LAB — IRON: Iron: 44 ug/dL (ref 42–145)

## 2020-11-01 LAB — VITAMIN B12: Vitamin B-12: 354 pg/mL (ref 211–911)

## 2020-11-01 LAB — TSH: TSH: 2.85 u[IU]/mL (ref 0.35–4.50)

## 2020-11-01 LAB — HEMOGLOBIN A1C: Hgb A1c MFr Bld: 6 % (ref 4.6–6.5)

## 2020-11-01 LAB — VITAMIN D 25 HYDROXY (VIT D DEFICIENCY, FRACTURES): VITD: 24.91 ng/mL — ABNORMAL LOW (ref 30.00–100.00)

## 2020-11-01 NOTE — Progress Notes (Signed)
Debra Wagner DOB: August 11, 1971 Encounter date: 11/01/2020  This is a 50 y.o. female who presents for complete physical   History of present illness/Additional concerns: Anxiety: Rare use of Xanax. Has had some increased stress with health concerns.   Last visit, she was working on regular exercise and losing weight.  She had been concerned with a few elevated blood pressure readings.  She had a follow-up with cardiology set up, but I do not see that this appointment was completed.  Sees cardiologist every 6 months. Dr. Sharyn Lull. She hasn't been checking blood pressures at home.  Hasn't been on any medications from blood pressure. This morning has slight headache - but feels related to nasal congestion, screen overuse.   Has been working on losing weight, but has gained some weight since last visit. Started nutrition program last Monday that seems to be working ok. Feels like she has become an emotional eater, but feels this is going well. Started with Herbal Life program. Drinking shakes x 2; higher protein lower carb meal; and 2 snacks. Usually she tries weight watchers and thinks she will go back to this after she loses some.   Last Pap smear completed 01/31/2017 with equal and was normal with negative HPV. Sees gynecologist through [place].   She isn't exercising regularly. She was walking regularly and felt better when doing this - helped all around.   Last mammogram was in November/2021.  There was some distortion in the left breast and patient was sent for follow-up stereotactic biopsy, but discussion was not found when she came for appointment, so follow-up diagnostic left mammogram was scheduled for 01/2021.  Feels like she keeps UTI all the time. If she drinks caffeine then it starts back. She has been getting treated from gynecologist. Last culture was not positive, but others have been. Has been more in the last 2 years. She is getting irregular periods; may be in pre-menopause.    Sometimes left hand going to sleep. She is right handed. Sometimes will active; sometimes waking up with it. Whole hand. Not weak. Used to have shoulder-neck pain but she does care for mom who is total care and lifting her a few times/day.   She did try phentermine in the past for weight loss - made her extremely anxious.   Past Medical History:  Diagnosis Date  . GAD (generalized anxiety disorder) 01/19/2015   Sees psychiatrist   . Heart murmur   . High cholesterol   . Migraines 01/19/2015  . Palpitations 01/19/2015   Sees cardiologist   . Seasonal allergies 01/19/2015   History reviewed. No pertinent surgical history. Allergies  Allergen Reactions  . Sulfa Antibiotics    Current Meds  Medication Sig  . ALPRAZolam (XANAX) 0.25 MG tablet Take 1 tablet (0.25 mg total) by mouth daily as needed for anxiety.  . Multiple Vitamin (MULTIVITAMIN) capsule Take 1 capsule by mouth daily.   Social History   Tobacco Use  . Smoking status: Never Smoker  . Smokeless tobacco: Never Used  Substance Use Topics  . Alcohol use: No   Family History  Problem Relation Age of Onset  . Heart disease Other   . Stroke Other   . Hypertension Other   . Sudden death Other   . Diabetes Other   . Stroke Mother 44  . High Cholesterol Mother   . CAD Mother   . Heart disease Father   . Heart attack Father 61  . High Cholesterol Father   . Heart  disease Sister   . Heart attack Sister 101  . Heart failure Sister   . Diabetes Sister   . Obesity Sister   . High blood pressure Brother   . Alzheimer's disease Maternal Grandmother   . Heart disease Paternal Grandmother   . Healthy Sister   . Healthy Brother   . Healthy Brother   . Alzheimer's disease Maternal Aunt      Review of Systems  Constitutional: Negative for activity change, appetite change, chills, fatigue, fever and unexpected weight change.  HENT: Negative for congestion, ear pain, hearing loss, sinus pressure, sinus pain, sore throat  and trouble swallowing.   Eyes: Negative for pain and visual disturbance.  Respiratory: Negative for cough, chest tightness, shortness of breath and wheezing.   Cardiovascular: Negative for chest pain, palpitations and leg swelling.  Gastrointestinal: Negative for abdominal pain, blood in stool, constipation, diarrhea, nausea and vomiting.  Genitourinary: Negative for difficulty urinating and menstrual problem.  Musculoskeletal: Negative for arthralgias and back pain.  Skin: Negative for rash.  Neurological: Negative for dizziness, weakness, numbness and headaches.  Hematological: Negative for adenopathy. Does not bruise/bleed easily.  Psychiatric/Behavioral: Negative for sleep disturbance and suicidal ideas. The patient is not nervous/anxious.     CBC:  Lab Results  Component Value Date   WBC 5.3 11/01/2020   HGB 13.0 11/01/2020   HCT 40.1 11/01/2020   MCHC 32.4 11/01/2020   RDW 15.7 (H) 11/01/2020   PLT 303.0 11/01/2020   CMP: Lab Results  Component Value Date   NA 137 11/01/2020   NA 138 05/09/2016   K 4.0 11/01/2020   CL 103 11/01/2020   CO2 27 11/01/2020   GLUCOSE 95 11/01/2020   BUN 14 11/01/2020   BUN 7 05/09/2016   CREATININE 0.74 11/01/2020   CALCIUM 9.5 11/01/2020   PROT 7.9 11/01/2020   BILITOT 0.4 11/01/2020   ALKPHOS 88 11/01/2020   ALT 14 11/01/2020   AST 17 11/01/2020   LIPID: Lab Results  Component Value Date   CHOL 192 11/01/2020   TRIG 54.0 11/01/2020   HDL 47.00 11/01/2020   LDLCALC 134 (H) 11/01/2020    Objective:  BP (!) 144/90 (BP Location: Right Arm, Patient Position: Sitting, Cuff Size: Large)   Pulse 84   Temp 98.4 F (36.9 C) (Oral)   Ht 5' 2.5" (1.588 m)   Wt 232 lb 9.6 oz (105.5 kg)   LMP 10/24/2020   BMI 41.87 kg/m   Weight: 232 lb 9.6 oz (105.5 kg)   BP Readings from Last 3 Encounters:  11/01/20 (!) 144/90  08/03/20 (!) 154/94  05/10/20 136/74   Wt Readings from Last 3 Encounters:  11/01/20 232 lb 9.6 oz (105.5 kg)   08/03/20 225 lb 9.6 oz (102.3 kg)  05/10/20 225 lb 9.6 oz (102.3 kg)    Physical Exam Constitutional:      General: She is not in acute distress.    Appearance: She is well-developed and well-nourished.  HENT:     Head: Normocephalic and atraumatic.     Right Ear: Tympanic membrane, ear canal and external ear normal.     Left Ear: Tympanic membrane, ear canal and external ear normal.     Ears:     Comments: No TMJ tenderness    Mouth/Throat:     Mouth: Oropharynx is clear and moist.     Pharynx: No oropharyngeal exudate.  Eyes:     Conjunctiva/sclera: Conjunctivae normal.     Pupils: Pupils are equal, round,  and reactive to light.  Neck:     Thyroid: No thyromegaly.  Cardiovascular:     Rate and Rhythm: Normal rate and regular rhythm.     Heart sounds: Normal heart sounds. No murmur heard. No friction rub. No gallop.   Pulmonary:     Effort: Pulmonary effort is normal.     Breath sounds: Normal breath sounds.  Abdominal:     General: Bowel sounds are normal. There is no distension.     Palpations: Abdomen is soft. There is no mass.     Tenderness: There is no abdominal tenderness. There is no guarding.     Hernia: No hernia is present.  Musculoskeletal:        General: No tenderness, deformity or edema. Normal range of motion.     Cervical back: Normal range of motion and neck supple.     Comments: No restriction with range of motion.  Lymphadenopathy:     Cervical: No cervical adenopathy.  Skin:    General: Skin is warm and dry.     Findings: No rash.  Neurological:     Mental Status: She is alert and oriented to person, place, and time.     Deep Tendon Reflexes: Strength normal. Reflexes normal.     Reflex Scores:      Tricep reflexes are 2+ on the right side and 2+ on the left side.      Bicep reflexes are 2+ on the right side and 2+ on the left side.      Brachioradialis reflexes are 2+ on the right side and 2+ on the left side.      Patellar reflexes are 2+ on  the right side and 2+ on the left side.    Comments: +phalens  Psychiatric:        Mood and Affect: Mood and affect normal.        Speech: Speech normal.        Behavior: Behavior normal.        Thought Content: Thought content normal.     Assessment/Plan: Health Maintenance Due  Topic Date Due  . COLONOSCOPY (Pts 45-34yrs Insurance coverage will need to be confirmed)  Never done   Health Maintenance reviewed.  1. Preventative health care She is working on weight loss and we discussed importance of regular exercise if she plans to resume.  She is otherwise up-to-date with preventative health care.  2. Morbid obesity (HCC) We are going to start with blood work and will determine possible interventions to help with weight loss pending lab results.  3. Other migraine without status migrainosus, not intractable She has done well with migraines and is not getting these often.  4. Seasonal allergies Stable off of medication.  5. GAD (generalized anxiety disorder) Anxiety is an ongoing challenge for her.  We discussed consideration for regular medication to help with better anxiety control.  She does feel that anxiety was better controlled when exercising regularly, so we discussed starting with this.  Additionally, we discussed beta-blocker since her blood pressure was slightly elevated today and she has been on that in the past with some benefit to anxiety.  Prefers to avoid SSRIs due to weight gain with them in the past.  6. Ear pain, bilateral I suspect intermittent eustachian tube dysfunction.  We discussed ways to treat this including Flonase and antihistamines.  She did request to see ENT for follow-up so we will let them see if they suggest any other treatments or interventions  for ear pain. - Ambulatory referral to ENT  7. Elevated blood pressure reading She is going to check at home and report back to me when I call with blood work results. - CBC with Differential/Platelet;  Future - Comprehensive metabolic panel; Future - CBC with Differential/Platelet - Comprehensive metabolic panel  8. B12 deficiency - Vitamin B12; Future - IBC Panel([facility]); Future - Iron; Future - Vitamin B12 - Iron - IBC Panel([facility])  9. Vitamin D deficiency - VITAMIN D 25 Hydroxy (Vit-D Deficiency, Fractures); Future - IBC Panel([facility]); Future - Iron; Future - VITAMIN D 25 Hydroxy (Vit-D Deficiency, Fractures) - Iron - IBC Panel([facility])  10. Lipid screening - Lipid panel; Future - Lipid panel  11. Elevated glucose - Hemoglobin A1c; Future - Hemoglobin A1c  12. Carpal tunnel syndrome of left wrist Wrist plan given in the office today.  Encourage patient to wear this for 2 weeks during day and sleep.  Let me know if not improved after this.  13. Fatigue, unspecified type - TSH; Future - IBC Panel([facility]); Future - Iron; Future - TSH - Iron - IBC Panel([facility])  Return in about 1 month (around 11/29/2020) for bp and weight recheck.  Theodis Shove, MD

## 2020-11-02 ENCOUNTER — Other Ambulatory Visit: Payer: Self-pay | Admitting: Family Medicine

## 2020-12-10 ENCOUNTER — Encounter: Payer: Self-pay | Admitting: Family Medicine

## 2020-12-10 ENCOUNTER — Ambulatory Visit (INDEPENDENT_AMBULATORY_CARE_PROVIDER_SITE_OTHER): Payer: 59 | Admitting: Family Medicine

## 2020-12-10 ENCOUNTER — Other Ambulatory Visit: Payer: Self-pay

## 2020-12-10 VITALS — BP 138/90 | HR 86 | Temp 98.8°F | Ht 62.5 in | Wt 227.3 lb

## 2020-12-10 DIAGNOSIS — E8881 Metabolic syndrome: Secondary | ICD-10-CM | POA: Diagnosis not present

## 2020-12-10 DIAGNOSIS — R030 Elevated blood-pressure reading, without diagnosis of hypertension: Secondary | ICD-10-CM | POA: Diagnosis not present

## 2020-12-10 DIAGNOSIS — Z1211 Encounter for screening for malignant neoplasm of colon: Secondary | ICD-10-CM

## 2020-12-10 MED ORDER — METFORMIN HCL 500 MG PO TABS: 500.0000 mg | ORAL_TABLET | Freq: Two times a day (BID) | ORAL | 3 refills | Status: DC

## 2020-12-10 NOTE — Patient Instructions (Signed)
With the metformin - eventually we would have you take it twice daily, but to start, I would just take once daily in the evening to see how you tolerate. Biggest side effect is loose stools; adding fiber can help with this.

## 2020-12-10 NOTE — Progress Notes (Signed)
Debra Wagner DOB: Jul 19, 1971 Encounter date: 12/10/2020  This is a 50 y.o. female who presents with Chief Complaint  Patient presents with  . Follow-up    History of present illness: Last visit was 11/01/20 for preventative health.  She was working on weight loss at that time.   She sees cardio - 120/68 at his office today. She has started back in last couple of days with headache. Gets them with weather changes. Her numbers look good at cardiology. When she gets headache it lingers. She does take zyrtec but just when she gets headaches.   At home she was checking bp, but hasn't done this lately - last check was 138-140 systolic but that was after she saw me.   She is working out 4-5 days/week. She is working on [employer] as well. She stopped the herbal life due to increased anxiety. She has been following 1600 cal plan. Using app "looseit".   Peri-menopausal - recurrently gets symptoms of starting cycle but doesn't always have bleeding. She has had cycle about every other month. More than several times in a month has those sx of starting.    Allergies  Allergen Reactions  . Sulfa Antibiotics    Current Meds  Medication Sig  . ALPRAZolam (XANAX) 0.25 MG tablet Take 1 tablet (0.25 mg total) by mouth daily as needed for anxiety.  . metFORMIN (GLUCOPHAGE) 500 MG tablet Take 1 tablet (500 mg total) by mouth 2 (two) times daily with a meal.  . Multiple Vitamin (MULTIVITAMIN) capsule Take 1 capsule by mouth daily.    Review of Systems  Constitutional: Negative for chills, fatigue and fever.  Respiratory: Negative for cough, chest tightness, shortness of breath and wheezing.   Cardiovascular: Negative for chest pain, palpitations and leg swelling.  Neurological:       Anxiety; just when she was doing the herbal life    Objective:  BP 138/90 (BP Location: Left Arm, Patient Position: Sitting, Cuff Size: Large)   Pulse 86   Temp 98.8 F (37.1 C) (Oral)   Ht 5' 2.5"  (1.588 m)   Wt 227 lb 4.8 oz (103.1 kg)   LMP 10/02/2020 (Approximate)   BMI 40.91 kg/m   Weight: 227 lb 4.8 oz (103.1 kg)   BP Readings from Last 3 Encounters:  12/10/20 138/90  11/01/20 (!) 144/90  08/03/20 (!) 154/94   Wt Readings from Last 3 Encounters:  12/10/20 227 lb 4.8 oz (103.1 kg)  11/01/20 232 lb 9.6 oz (105.5 kg)  08/03/20 225 lb 9.6 oz (102.3 kg)    Physical Exam Neurological:     Mental Status: She is alert.  Psychiatric:        Attention and Perception: Attention normal.        Mood and Affect: Mood and affect normal.     Assessment/Plan  1. Insulin resistance We are going to try metformin to see if this aids at all with weight loss; insulin resistance. Discussed new medication(s) today with patient. Discussed potential side effects and patient verbalized understanding. We discussed ozempic as well if she is having difficulty with tolerating the metformin or we are not getting results. Keep up with exercise. We discussed low carb eating as well and how this will help her with weight loss. - metFORMIN (GLUCOPHAGE) 500 MG tablet; Take 1 tablet (500 mg total) by mouth 2 (two) times daily with a meal.  Dispense: 180 tablet; Refill: 3  2. Elevated blood pressure reading bp at cardiology have  been good. Patient has made him aware of her elevated pressures and he feels that his office cuff is reading accurately. I encouraged her to check bp at home, bring in cuff to doc visits, and we will continue to monitor. My recheck on her with thigh cuff today was 142/90.   3. Screening for colon cancer - Cologuard  4. Morbid obesity (HCC) Keep working on diet, exercise.    Return in about 3 months (around 03/12/2021) for Chronic condition visit.  30 minutes spent in counseling with patient re low carb eating, insulin resistance.   Theodis Shove, MD

## 2021-02-10 ENCOUNTER — Ambulatory Visit
Admission: RE | Admit: 2021-02-10 | Discharge: 2021-02-10 | Disposition: A | Discharge status: Home / Self Care | Payer: No Typology Code available for payment source | Source: Ambulatory Visit | Attending: Obstetrics & Gynecology | Admitting: Obstetrics & Gynecology

## 2021-02-10 ENCOUNTER — Other Ambulatory Visit: Payer: Self-pay | Admitting: Obstetrics & Gynecology

## 2021-02-10 ENCOUNTER — Other Ambulatory Visit: Payer: Self-pay

## 2021-02-10 DIAGNOSIS — N6489 Other specified disorders of breast: Secondary | ICD-10-CM

## 2021-03-14 ENCOUNTER — Ambulatory Visit (INDEPENDENT_AMBULATORY_CARE_PROVIDER_SITE_OTHER): Payer: 59 | Admitting: Family Medicine

## 2021-03-14 ENCOUNTER — Encounter: Payer: Self-pay | Admitting: Family Medicine

## 2021-03-14 ENCOUNTER — Other Ambulatory Visit: Payer: Self-pay

## 2021-03-14 VITALS — BP 138/90 | HR 90 | Temp 98.0°F | Ht 62.5 in | Wt 219.8 lb

## 2021-03-14 DIAGNOSIS — F419 Anxiety disorder, unspecified: Secondary | ICD-10-CM | POA: Diagnosis not present

## 2021-03-14 DIAGNOSIS — R7301 Impaired fasting glucose: Secondary | ICD-10-CM | POA: Diagnosis not present

## 2021-03-14 DIAGNOSIS — J302 Other seasonal allergic rhinitis: Secondary | ICD-10-CM | POA: Diagnosis not present

## 2021-03-14 DIAGNOSIS — R030 Elevated blood-pressure reading, without diagnosis of hypertension: Secondary | ICD-10-CM

## 2021-03-14 DIAGNOSIS — M67479 Ganglion, unspecified ankle and foot: Secondary | ICD-10-CM

## 2021-03-14 DIAGNOSIS — Q738 Other reduction defects of unspecified limb(s): Secondary | ICD-10-CM

## 2021-03-14 NOTE — Progress Notes (Addendum)
Debra Wagner DOB: 1971/03/13 Encounter date: 03/14/2021  This is a 50 y.o. female who presents with Chief Complaint  Patient presents with   Follow-up    History of present illness: Last visit was 11/2020  We discussed insulin resistance at that time and metformin was started.  We discussed consideration for Ozempic if having difficulty with weight loss.  She had been working on lower calorie eating.  Additionally, encouraged to check blood pressure at home since her reading is elevated here in the office. She has lost weight- states it was 10-11 lbs but gained some back. Still working on calorie counting. Still working out, but sometimes harder for her to stay on track with this - down to 2 days but was 5 days. She stopped the metformin. Just worried about taking medication. No side effects with this.   Knot on her foot that just came up. Podiatrist felt it and told her it was bone. Noted a few weeks ago. Not sore. Does feel like she wears more comfortable shoes in general. 4th digits hands and feet are missing bone - all are shorter - per mom they just stopped growing.   Did check pressures at home after last visit - was running highest 131; bottom numbers at home 80's. She does have a little headache today. Has allergies - and if she doesn't wear mask for yard work it seems to flare.   Exercise does help with anxiety control. 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   Allergies  Allergen Reactions   Sulfa Antibiotics    Current Meds  Medication Sig   ALPRAZolam (XANAX) 0.25 MG tablet Take 1 tablet (0.25 mg total) by mouth daily as needed for anxiety.   metFORMIN (GLUCOPHAGE) 500 MG tablet Take 1 tablet (500 mg total) by mouth 2 (two) times daily with a meal.   Multiple Vitamin (MULTIVITAMIN) capsule Take 1 capsule by mouth daily.    Review of Systems  Constitutional:  Negative for chills, fatigue and fever.  Respiratory:   Negative for cough, chest tightness, shortness of breath and wheezing.   Cardiovascular:  Negative for chest pain, palpitations and leg swelling.  Musculoskeletal:  Negative for arthralgias, joint swelling and myalgias.  Psychiatric/Behavioral:  The patient is nervous/anxious.    Objective:  BP 138/90 (BP Location: Left Arm, Patient Position: Sitting, Cuff Size: Large)   Pulse 90   Temp 98 F (36.7 C) (Oral)   Ht 5' 2.5" (1.588 m)   Wt 219 lb 12.8 oz (99.7 kg)   LMP 03/09/2021 (Exact Date)   SpO2 99%   BMI 39.56 kg/m   Weight: 219 lb 12.8 oz (99.7 kg)   BP Readings from Last 3 Encounters:  03/14/21 138/90  12/10/20 138/90  11/01/20 (!) 144/90   Wt Readings from Last 3 Encounters:  03/14/21 219 lb 12.8 oz (99.7 kg)  12/10/20 227 lb 4.8 oz (103.1 kg)  11/01/20 232 lb 9.6 oz (105.5 kg)    Physical Exam Constitutional:      General: She is not in acute distress.    Appearance: She is well-developed.  Cardiovascular:     Rate and Rhythm: Normal rate and regular rhythm.     Heart sounds: Normal heart sounds. No murmur heard.   No friction rub.  Pulmonary:     Effort: Pulmonary effort is normal. No respiratory distress.     Breath sounds: Normal breath sounds. No wheezing or rales.  Musculoskeletal:  Right lower leg: No edema.     Left lower leg: No edema.  Neurological:     Mental Status: She is alert and oriented to person, place, and time.  Psychiatric:        Attention and Perception: Attention normal.        Mood and Affect: Mood normal.        Speech: Speech normal.        Behavior: Behavior normal.    Assessment/Plan  1. Impaired fasting glucose She has been loosing weight; working on healthy eating.  She feels more comfortable not taking medication, so I have taken the metformin off her medication list.  As long as she is continuing to move in the right direction with weight loss and blood sugar staying stable, she should not need to be on any  medication.  2. Ganglion cyst of foot Feels like a cyst in the foot to me.  She is comfortable with the podiatrist that she has met through her mom.  Reassured her that this is not something concerning.  Of interest, it falls  along the plane of her shortened fourth digit. - Ambulatory referral to Podiatry  3. Seasonal allergies She is interested in better allergy control so that she does not have to take medication.  She would like to have further testing to identify allergy triggers. - Ambulatory referral to Allergy  4. Anxiety Currently managing her anxiety well. Continue to work on therapeutic techniques for anxiety control  5. Elevated bp reading Her pressure recheck in office today was a little higher - her stress level goes up in doc office as her anxiety tends to focus on health related concerns. I have advised her to monitor bp at home; check at different points during day and update me with numbers in a week. I have asked her to check her allergy meds at home and make sure no decongestant included since this may be adding to elevated pressure today.  Return in about 4 months (around 07/14/2021) for Chronic condition visit.she prefers to keep close tabs on her sugar, weight, blood pressure.     Micheline Rough, MD

## 2021-03-14 NOTE — Patient Instructions (Addendum)
If possible - try to avoid the "D" allergy meds as this can raise your bp.   Check blood pressures at home: try to check at different times of the day (no more than 3 times/day). Write down numbers and then send to me in 1 weeks time - just type them in mychart.

## 2021-03-15 DIAGNOSIS — Q738 Other reduction defects of unspecified limb(s): Secondary | ICD-10-CM | POA: Insufficient documentation

## 2021-03-17 ENCOUNTER — Encounter: Payer: Self-pay | Admitting: Family Medicine

## 2021-04-07 ENCOUNTER — Ambulatory Visit (INDEPENDENT_AMBULATORY_CARE_PROVIDER_SITE_OTHER): Payer: 59 | Admitting: Podiatry

## 2021-04-07 ENCOUNTER — Other Ambulatory Visit: Payer: Self-pay

## 2021-04-07 ENCOUNTER — Ambulatory Visit (INDEPENDENT_AMBULATORY_CARE_PROVIDER_SITE_OTHER): Payer: 59

## 2021-04-07 DIAGNOSIS — M7989 Other specified soft tissue disorders: Secondary | ICD-10-CM | POA: Diagnosis not present

## 2021-04-07 DIAGNOSIS — Q72899 Other reduction defects of unspecified lower limb: Secondary | ICD-10-CM

## 2021-04-11 NOTE — Progress Notes (Signed)
Subjective:   Patient ID: Debra Wagner, female   DOB: 50 y.o.   MRN: [id number]   HPI 50 year old female presents the office today for concerns of a soft tissue mass, possible cyst has been on for about 2 months the lateral aspect of the left foot.  She denies any recent injury or trauma.  Not changed in size.  She has no pain associated with it.  She denies any recent treatment.  She did discuss with her primary care physician who referred her to [place].  No other concerns.  As a secondary question she has noticed her fourth toes as well as her fingers on both sides are short.  No pain with the foot but she was curious of what was going on.  Review of Systems  All other systems reviewed and are negative.  Past Medical History:  Diagnosis Date   GAD (generalized anxiety disorder) 01/19/2015   Sees psychiatrist    Heart murmur    High cholesterol    Migraines 01/19/2015   Palpitations 01/19/2015   Sees cardiologist    Seasonal allergies 01/19/2015    No past surgical history on file.   Current Outpatient Medications:    ALPRAZolam (XANAX) 0.25 MG tablet, Take 1 tablet (0.25 mg total) by mouth daily as needed for anxiety., Disp: 30 tablet, Rfl: 0   Multiple Vitamin (MULTIVITAMIN) capsule, Take 1 capsule by mouth daily., Disp: , Rfl:   Allergies  Allergen Reactions   Sulfa Antibiotics     Other reaction(s): Other (See Comments) Other Reaction: OTHER REACTION          Objective:  Physical Exam  General: AAO x3, NAD  Dermatological: Skin is warm, dry and supple bilateral. There are no open sores, no preulcerative lesions, no rash or signs of infection present.  Vascular: Dorsalis Pedis artery and Posterior Tibial artery pedal pulses are 2/4 bilateral with immedate capillary fill time.  There is no pain with calf compression, swelling, warmth, erythema.   Neruologic: Grossly intact via light touch bilateral.   Musculoskeletal: There is a soft mobile soft tissue mass  present on the dorsal lateral aspect of the midfoot along the lateral aspect and extensor tendons on the top of the foot.  There is no pain associated this is no edema, erythema or signs of infection.  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  No pain associate with this.  Muscular strength 5/5 in all groups tested bilateral.  Gait: Unassisted, Nonantalgic.       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  No is of acute fracture or stress fracture.  No calcifications present.  Shortened fourth metatarsal bilaterally. -In regards to the cyst is not causing any pain does not change in size.  Discussed we will try to aspirate the area but after discussion she wants to watch it.  If it were to get bigger in size or cause discomfort discussed aspiration or excision/biopsy. -Discussed brachymetatarsia.  Currently asymptomatic.  As is not causing any issues I would not do the surgery for cosmetic reasons as is not bothering her.  If it becomes symptomatic consider surgical intervention.  Vivi Barrack DPM

## 2021-05-19 ENCOUNTER — Emergency Department (HOSPITAL_COMMUNITY)
Admission: EM | Admit: 2021-05-19 | Discharge: 2021-05-19 | Disposition: A | Discharge status: Home / Self Care | Payer: 59 | Attending: Emergency Medicine | Admitting: Emergency Medicine

## 2021-05-19 ENCOUNTER — Other Ambulatory Visit: Payer: Self-pay

## 2021-05-19 DIAGNOSIS — I10 Essential (primary) hypertension: Secondary | ICD-10-CM | POA: Insufficient documentation

## 2021-05-19 DIAGNOSIS — R000 Tachycardia, unspecified: Secondary | ICD-10-CM | POA: Insufficient documentation

## 2021-05-19 DIAGNOSIS — R030 Elevated blood-pressure reading, without diagnosis of hypertension: Secondary | ICD-10-CM | POA: Diagnosis present

## 2021-05-19 LAB — URINALYSIS, ROUTINE W REFLEX MICROSCOPIC
Bilirubin Urine: NEGATIVE
Glucose, UA: NEGATIVE mg/dL
Hgb urine dipstick: NEGATIVE
Ketones, ur: NEGATIVE mg/dL
Leukocytes,Ua: NEGATIVE
Nitrite: NEGATIVE
Protein, ur: NEGATIVE mg/dL
Specific Gravity, Urine: 1.017 (ref 1.005–1.030)
pH: 5 (ref 5.0–8.0)

## 2021-05-19 LAB — CBC WITH DIFFERENTIAL/PLATELET
Abs Immature Granulocytes: 0.02 10*3/uL (ref 0.00–0.07)
Basophils Absolute: 0 10*3/uL (ref 0.0–0.1)
Basophils Relative: 1 %
Eosinophils Absolute: 0.1 10*3/uL (ref 0.0–0.5)
Eosinophils Relative: 2 %
HCT: 42 % (ref 36.0–46.0)
Hemoglobin: 13 g/dL (ref 12.0–15.0)
Immature Granulocytes: 0 %
Lymphocytes Relative: 45 %
Lymphs Abs: 3.1 10*3/uL (ref 0.7–4.0)
MCH: 25.4 pg — ABNORMAL LOW (ref 26.0–34.0)
MCHC: 31 g/dL (ref 30.0–36.0)
MCV: 82.2 fL (ref 80.0–100.0)
Monocytes Absolute: 0.8 10*3/uL (ref 0.1–1.0)
Monocytes Relative: 11 %
Neutro Abs: 2.8 10*3/uL (ref 1.7–7.7)
Neutrophils Relative %: 41 %
Platelets: 276 10*3/uL (ref 150–400)
RBC: 5.11 MIL/uL (ref 3.87–5.11)
RDW: 15 % (ref 11.5–15.5)
WBC: 6.9 10*3/uL (ref 4.0–10.5)
nRBC: 0 % (ref 0.0–0.2)

## 2021-05-19 LAB — COMPREHENSIVE METABOLIC PANEL
ALT: 20 U/L (ref 0–44)
AST: 24 U/L (ref 15–41)
Albumin: 4 g/dL (ref 3.5–5.0)
Alkaline Phosphatase: 93 U/L (ref 38–126)
Anion gap: 11 (ref 5–15)
BUN: 9 mg/dL (ref 6–20)
CO2: 23 mmol/L (ref 22–32)
Calcium: 8.9 mg/dL (ref 8.9–10.3)
Chloride: 105 mmol/L (ref 98–111)
Creatinine, Ser: 0.82 mg/dL (ref 0.44–1.00)
GFR, Estimated: >60 mL/min (ref >60)
Glucose, Bld: 115 mg/dL — ABNORMAL HIGH (ref 70–99)
Potassium: 3.6 mmol/L (ref 3.5–5.1)
Sodium: 139 mmol/L (ref 135–145)
Total Bilirubin: 0.5 mg/dL (ref 0.3–1.2)
Total Protein: 7.7 g/dL (ref 6.5–8.1)

## 2021-05-19 LAB — PREGNANCY, URINE: Preg Test, Ur: NEGATIVE

## 2021-05-19 MED ORDER — METOPROLOL TARTRATE 25 MG PO TABS: 25.0000 mg | ORAL_TABLET | Freq: Every day | ORAL | 0 refills | Status: DC

## 2021-05-19 NOTE — Discharge Instructions (Addendum)
Return for any problem.  ?

## 2021-05-19 NOTE — ED Provider Notes (Addendum)
Emergency Medicine Provider Triage Evaluation Note  Debra Wagner , a 49 y.o. female  was evaluated in triage.  Pt complains of High blood pressure.  Her blood pressure was 200 systolic, advised to go to the ED for further evaluation.  She has been having headaches last few days, but denies any chest pain.  She does not take medicine for blood pressure, she is prescribed Xanax for anxiety but denies any other medical issues..  Review of Systems  Positive: HA, HTN Negative: CP  Physical Exam  BP (!) 229/71 (BP Location: Left Arm)   Pulse (!) 144   Temp 99.4 F (37.4 C) (Oral)   Resp 18   SpO2 98%  Gen:   Awake, no distress   Resp:  Normal effort  MSK:   Moves extremities without difficulty  Other:  Regular rhythm, but tachycardic to the 140s.  Medical Decision Making  Medically screening exam initiated at 10:55 AM.  Appropriate orders placed.  Debra Wagner was informed that the remainder of the evaluation will be completed by another provider, this initial triage assessment does not replace that evaluation, and the importance of remaining in the ED until their evaluation is complete.     Theron Arista, PA-C 05/19/21 1057    Theron Arista, PA-C 05/19/21 1122    Wynetta Fines, MD 05/21/21 [phone]

## 2021-05-19 NOTE — ED Triage Notes (Signed)
Pt sent from [facility] clinic for eval of hypertension-systolic >200. Not medicated for same. Denies cp, shob. Endorses headaches x a couple weeks. Hx anxiety.

## 2021-05-19 NOTE — ED Provider Notes (Signed)
[name] EMERGENCY DEPARTMENT Provider Note   CSN: [id number] Arrival date & time: 05/19/21  1043     History Chief Complaint  Patient presents with   Hypertension    Debra Wagner is a 50 y.o. female.  50 year old female with prior medical history as detailed below presents for evaluation of reported hypertension.  Patient reports that she felt like her blood pressure is high.  She had it checked at [facility] and was instructed to come to the ED for evaluation.  She denies prior history of hypertension.  She does report prior history of anxiety with related palpitations.  She does not take any medications routinely for treatment of anxiety or for her blood pressure.  She denies associated headache, chest pain, shortness of breath, nausea, vomiting, or other acute complaint.  She reports increased stress over the last several days related to work and home life.  She denies other psychiatric complaint.  The history is provided by the patient.  Hypertension This is a new problem. The current episode started 6 to 12 hours ago. The problem occurs rarely. The problem has not changed since onset.Pertinent negatives include no chest pain and no abdominal pain. Nothing aggravates the symptoms. Nothing relieves the symptoms.      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   OB History   No obstetric history on file.     Family History  Problem Relation Age of Onset   Heart disease Other    Stroke Other     Hypertension Other    Sudden death Other    Diabetes Other    Stroke Mother 34   High Cholesterol Mother    CAD Mother    Heart disease Father    Heart attack Father 66   High Cholesterol Father    Heart disease Sister    Heart attack Sister 66   Heart failure Sister    Diabetes Sister    Obesity Sister    High blood pressure Brother    Alzheimer's disease Maternal Grandmother    Heart disease Paternal Grandmother    Healthy Sister    Healthy Brother    Healthy Brother    Alzheimer's disease Maternal Aunt     Social History   Tobacco Use   Smoking status: Never   Smokeless tobacco: Never  Vaping Use   Vaping Use: Never used  Substance Use Topics   Alcohol use: No   Drug use: Never    Home Medications Prior to Admission medications   Medication Sig Start Date End Date Taking? Authorizing Provider  metoprolol tartrate (LOPRESSOR) 25 MG tablet Take 1 tablet (25 mg total) by mouth daily. 05/19/21  Yes Wynetta Fines, MD  ALPRAZolam Prudy Feeler) 0.25 MG tablet Take 1 tablet (0.25 mg total) by mouth daily as needed for anxiety. 07/23/19   Wynn Banker, MD  Multiple Vitamin (MULTIVITAMIN) capsule Take 1 capsule by mouth daily.    [provider]    Allergies  Sulfa antibiotics  Review of Systems   Review of Systems  Cardiovascular:  Negative for chest pain.  Gastrointestinal:  Negative for abdominal pain.  All other systems reviewed and are negative.  Physical Exam Updated Vital Signs BP (!) 198/109   Pulse (!) 114   Temp 99.4 F (37.4 C) (Oral)   Resp 17   SpO2 100%   Physical Exam Vitals and nursing note reviewed.  Constitutional:      General: She is not in acute distress.    Appearance: Normal appearance. She is well-developed.  HENT:     Head: Normocephalic and atraumatic.  Eyes:     Conjunctiva/sclera: Conjunctivae normal.     Pupils: Pupils are equal, round, and reactive to light.  Cardiovascular:     Rate and Rhythm: Normal rate  and regular rhythm.     Heart sounds: Normal heart sounds.  Pulmonary:     Effort: Pulmonary effort is normal. No respiratory distress.     Breath sounds: Normal breath sounds.  Abdominal:     General: There is no distension.     Palpations: Abdomen is soft.     Tenderness: There is no abdominal tenderness.  Musculoskeletal:        General: No deformity. Normal range of motion.     Cervical back: Normal range of motion and neck supple.  Skin:    General: Skin is warm and dry.  Neurological:     General: No focal deficit present.     Mental Status: She is alert and oriented to person, place, and time.    ED Results / Procedures / Treatments   Labs (all labs ordered are listed, but only abnormal results are displayed) Labs Reviewed  URINALYSIS, ROUTINE W REFLEX MICROSCOPIC - Abnormal; Notable for the following components:      Result Value   APPearance HAZY (*)    All other components within normal limits  CBC WITH DIFFERENTIAL/PLATELET - Abnormal; Notable for the following components:   MCH 25.4 (*)    All other components within normal limits  COMPREHENSIVE METABOLIC PANEL - Abnormal; Notable for the following components:   Glucose, Bld 115 (*)    All other components within normal limits  PREGNANCY, URINE    EKG EKG Interpretation  Date/Time:  Thursday May 19 2021 10:56:46 EDT Ventricular Rate:  137 PR Interval:  148 QRS Duration: 80 QT Interval:  280 QTC Calculation: 422 R Axis:   90 Text Interpretation: Sinus tachycardia Right atrial enlargement Rightward axis Pulmonary disease pattern Abnormal ECG Confirmed by Kristine Royal [phone]) on 05/19/2021 11:28:39 AM  Radiology No results found.  Procedures Procedures   Medications Ordered in ED Medications - No data to display  ED Course  I have reviewed the triage vital signs and the nursing notes.  Pertinent labs & imaging results that were available during my care of the patient were reviewed by me and  considered in my medical decision making (see chart for details).    MDM Rules/Calculators/A&P                           MDM  MSE  complete  Debra Wagner was evaluated in Emergency Department on 05/19/2021 for the symptoms described in the history of present illness. She was evaluated in the context of the global COVID-19 pandemic, which necessitated consideration that the patient might be at risk for infection with the SARS-CoV-2 virus that causes COVID-19. Institutional protocols and  algorithms that pertain to the evaluation of patients at risk for COVID-19 are in a state of rapid change based on information released by regulatory bodies including the CDC and federal and state organizations. These policies and algorithms were followed during the patient's care in the ED.  Patient is presenting with complaint of hypertension.  She is without evidence of endorgan damage.  Patient with significant anxiety.  Patient's reported hypertension may be related to significant anxiety.  Patient does have established follow-up with her PCP on Monday of this next week.  Will prescribe short course of metoprolol for treatment of tachycardia and anxiety related symptoms.  Importance of close follow-up stressed.  Strict return precautions given.    Final Clinical Impression(s) / ED Diagnoses Final diagnoses:  Hypertension, unspecified type    Rx / DC Orders ED Discharge Orders          Ordered    metoprolol tartrate (LOPRESSOR) 25 MG tablet  Daily        05/19/21 1250             Wynetta Fines, MD 05/19/21 1258

## 2021-05-23 ENCOUNTER — Ambulatory Visit: Payer: 59 | Admitting: Family Medicine

## 2021-05-23 ENCOUNTER — Encounter: Payer: Self-pay | Admitting: Family Medicine

## 2021-05-23 ENCOUNTER — Ambulatory Visit (INDEPENDENT_AMBULATORY_CARE_PROVIDER_SITE_OTHER): Payer: 59 | Admitting: Family Medicine

## 2021-05-23 ENCOUNTER — Other Ambulatory Visit: Payer: Self-pay

## 2021-05-23 VITALS — BP 150/92 | HR 64 | Temp 98.3°F | Ht 62.5 in | Wt 220.1 lb

## 2021-05-23 DIAGNOSIS — I10 Essential (primary) hypertension: Secondary | ICD-10-CM | POA: Diagnosis not present

## 2021-05-23 DIAGNOSIS — R7309 Other abnormal glucose: Secondary | ICD-10-CM | POA: Diagnosis not present

## 2021-05-23 DIAGNOSIS — E559 Vitamin D deficiency, unspecified: Secondary | ICD-10-CM | POA: Diagnosis not present

## 2021-05-23 DIAGNOSIS — R002 Palpitations: Secondary | ICD-10-CM | POA: Diagnosis not present

## 2021-05-23 DIAGNOSIS — F419 Anxiety disorder, unspecified: Secondary | ICD-10-CM

## 2021-05-23 DIAGNOSIS — E785 Hyperlipidemia, unspecified: Secondary | ICD-10-CM

## 2021-05-23 LAB — POCT GLYCOSYLATED HEMOGLOBIN (HGB A1C): Hemoglobin A1C: 6 % — AB (ref 4.0–5.6)

## 2021-05-23 MED ORDER — AMLODIPINE BESYLATE 2.5 MG PO TABS
2.5000 mg | ORAL_TABLET | Freq: Every day | ORAL | 1 refills | Status: DC
Start: 2021-05-23 — End: 2021-06-02

## 2021-05-23 MED ORDER — BUSPIRONE HCL 15 MG PO TABS: 7.5000 mg | ORAL_TABLET | Freq: Two times a day (BID) | ORAL | 2 refills | Status: DC

## 2021-05-23 NOTE — Progress Notes (Signed)
Debra Wagner DOB: 1971-02-05 Encounter date: 05/23/2021  This is a 50 y.o. female who presents with Chief Complaint  Patient presents with   Headache    X1 week, seen at the emergency room and diagnosed with elevated blood pressure    History of present illness: Was in ED 05/09/21 with hypertensive episode and started on metoprolol. Bp at that time were recorded as 229/71, 198/109. Temp up to 99.4  Knows under some stress and battling headaches. Sometimes taking something; sometimes not. Wonders if allergies - sometimes ears popping. Did take zyrtec a couple of days in a row. Went to [facility] and pressure was 200's/100's. HR was up in 150's and bp was in 200's when she get into triage. States that bp was down in 160's by time she got calmed down. She is worried about this; checking bp all the time. Yesterday got 195, 170 systolic.   Metoprolol has helped with HR - running in the high 50-low 60's. Up to 80's. In past has just noted that HR was fast when anxious.   Has started to put some weight back on.   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   Saw cardiology on 8/19 - said to stay on metoprolol, lose weight, exercise. She was put on this medication years ago; doesn't remember how it worked or how long she was on this. At our previous visits she had stated that her bp at cardiology was always good and that he trusted his readings.   Had tried buspar one time - but was worried due to label that it could increase anxiety.    Allergies  Allergen Reactions   Sulfa Antibiotics     Other reaction(s): Other (See Comments) Other Reaction: OTHER REACTION   Current Meds  Medication Sig   ALPRAZolam (XANAX) 0.25 MG tablet Take 1 tablet (0.25 mg total) by mouth daily as needed for anxiety.   metoprolol tartrate (LOPRESSOR) 25 MG tablet Take 1 tablet (25 mg total) by mouth daily.   Multiple Vitamin (MULTIVITAMIN) capsule Take 1 capsule by mouth daily.     Review of Systems  Constitutional:  Negative for chills, fatigue and fever.  Respiratory:  Negative for cough, chest tightness, shortness of breath and wheezing.   Cardiovascular:  Negative for chest pain, palpitations and leg swelling.  Neurological:  Positive for headaches.  Psychiatric/Behavioral:  The patient is nervous/anxious.    Objective:  BP (!) 150/92 (BP Location: Left Arm, Patient Position: Sitting, Cuff Size: Large)   Pulse 64   Temp 98.3 F (36.8 C) (Oral)   Ht 5' 2.5" (1.588 m)   Wt 220 lb 1.6 oz (99.8 kg)   SpO2 99%   BMI 39.62 kg/m   Weight: 220 lb 1.6 oz (99.8 kg)   BP Readings from Last 3 Encounters:  05/23/21 (!) 150/92  05/19/21 (!) 164/94  03/14/21 138/90   Wt Readings from Last 3 Encounters:  05/23/21 220 lb 1.6 oz (99.8 kg)  03/14/21 219 lb 12.8 oz (99.7 kg)  12/10/20 227 lb 4.8 oz (103.1 kg)    Physical Exam Constitutional:      General: She is not in acute distress.    Appearance: She is well-developed.  Cardiovascular:     Rate and Rhythm: Normal rate and regular rhythm.     Heart sounds: Normal heart sounds. No murmur heard.   No friction rub.  Pulmonary:     Effort: Pulmonary effort is normal. No respiratory distress.  Breath sounds: Normal breath sounds. No wheezing or rales.  Musculoskeletal:     Right lower leg: No edema.     Left lower leg: No edema.  Neurological:     Mental Status: She is alert and oriented to person, place, and time.  Psychiatric:        Attention and Perception: Attention normal.        Mood and Affect: Mood is anxious.        Speech: Speech normal.        Behavior: Behavior normal.    Assessment/Plan  1. Hypertension, unspecified type She follows with cardiology and has been prescribed metoprolol succinate.  We discussed that this will be better for blood pressure control than the metoprolol tartrate she was given in the emergency room which was only written for once daily dosing.  We are going to  add 2.5 mg of amlodipine to help with better blood pressure control.  Advised her to monitor her blood pressures at home. - amLODipine (NORVASC) 2.5 MG tablet; Take 1 tablet (2.5 mg total) by mouth daily.  Dispense: 90 tablet; Refill: 1  2. Palpitations We will check thyroid. Metoprolol succinate should help with control of this as well. - TSH; Future  3. Anxiety We discussed adding BuSpar to help with anxiety.  We discussed regular exercise to also help with anxiety control.  - busPIRone (BUSPAR) 15 MG tablet; Take 0.5 tablets (7.5 mg total) by mouth 2 (two) times daily.  Dispense: 60 tablet; Refill: 2  4. Vitamin D deficiency - VITAMIN D 25 Hydroxy (Vit-D Deficiency, Fractures); Future  5. Elevated glucose Has maintained A1c at 6.0.keep up the good work! - POC HgB A1c  6. Hyperlipidemia, unspecified hyperlipidemia type - Lipid panel; Future   Return in about 1 month (around 06/23/2021) for Chronic condition visit.   46 minutes spent in visit, discussion symtpoms, blood presure and anxiety treatment options with patient, charting, chart review.    Theodis Shove, MD

## 2021-05-23 NOTE — Patient Instructions (Addendum)
Metoprolol tartrate is a twice daily medication. Please increase what you are taking to 1 tablet by mouth twice daily (around 12 hours apart) (if cardiologist gave you metoprolol succinate, then this is a once a day medication)  Continue to monitor blood pressures at home.   If you decide you want to try the buspar - start with half tab 7.5mg  twice daily and if tolerating can increase to 15mg  twice daily.   Add amlodipine 2.5mg  at bedtime.   Ok to update me in a week or two with blood pressures or call if you have concerns.

## 2021-05-30 ENCOUNTER — Encounter: Payer: Self-pay | Admitting: Family Medicine

## 2021-06-02 ENCOUNTER — Encounter: Payer: Self-pay | Admitting: Allergy

## 2021-06-02 ENCOUNTER — Other Ambulatory Visit: Payer: Self-pay

## 2021-06-02 ENCOUNTER — Ambulatory Visit (INDEPENDENT_AMBULATORY_CARE_PROVIDER_SITE_OTHER): Payer: 59 | Admitting: Allergy

## 2021-06-02 VITALS — BP 142/90 | HR 80 | Temp 98.0°F | Resp 16 | Ht 62.5 in | Wt 218.1 lb

## 2021-06-02 DIAGNOSIS — J3089 Other allergic rhinitis: Secondary | ICD-10-CM | POA: Diagnosis not present

## 2021-06-02 DIAGNOSIS — G4489 Other headache syndrome: Secondary | ICD-10-CM | POA: Diagnosis not present

## 2021-06-02 MED ORDER — FLUTICASONE PROPIONATE 50 MCG/ACT NA SUSP: 2.0000 | Freq: Every day | NASAL | 5 refills | Status: AC

## 2021-06-02 MED ORDER — CETIRIZINE HCL 10 MG PO TABS: 10.0000 mg | ORAL_TABLET | Freq: Every day | ORAL | 5 refills | Status: AC

## 2021-06-02 NOTE — Progress Notes (Signed)
New Patient Note  RE: Debra Wagner MRN: [id number] DOB: Feb 24, 1971 Date of Office Visit: 06/02/2021  Referring provider: Wynn Banker, MD Primary care provider: Wynn Banker, MD  Chief Complaint: Headaches  History of present illness: Debra Wagner is a 50 y.o. female presenting today for consultation for sinus headaches.   She states she has been having sinus headaches for couple years now.  She reports headache that can be in the forehead area or temple area.   They could occur anytime of the year.  She would have headaches almost daily for a while.  The headache is a dull feeling.  She states she would have vision changes and would be light sensitive.  Denies any motion sensitivity or nausea/vomiting.  She states taking zyrtec was helpful in controlling the migraines.  She states there was a period of time where she did take Zyrtec daily and took Excedrin every other day for about a month and states she did not have any headaches.  However lately she states she has been worried to take zyrtec as she was diagnosed with hypertension.  It is reported to me that her doctor is wondering if the headache is related to allergies or her blood pressure.  She was started on a blood pressure medications about a week ago.  In this past week she has not had any headaches.  She also reports some sinus drainage as well.  Sometimes would have ear pain occasionally.   No history of asthma.  There is no food allergy concern.  Review of systems: Review of Systems  Constitutional: Negative.   HENT: Negative.    Eyes: Negative.   Respiratory: Negative.    Cardiovascular: Negative.   Gastrointestinal: Negative.   Musculoskeletal: Negative.   Skin: Negative.   Neurological:  Positive for headaches.   All other systems negative unless noted above in HPI  Past medical history: Past Medical History:  Diagnosis Date   GAD (generalized anxiety disorder) 01/19/2015   Sees  psychiatrist    Heart murmur    High cholesterol    Migraines 01/19/2015   Palpitations 01/19/2015   Sees cardiologist    Seasonal allergies 01/19/2015    Past surgical history: History reviewed. No pertinent surgical history.  Family history:  Family History  Problem Relation Age of Onset   Heart disease Other    Stroke Other    Hypertension Other    Sudden death Other    Diabetes Other    Stroke Mother 63   High Cholesterol Mother    CAD Mother    Heart disease Father    Heart attack Father 65   High Cholesterol Father    Heart disease Sister    Heart attack Sister 42   Heart failure Sister    Diabetes Sister    Obesity Sister    High blood pressure Brother    Alzheimer's disease Maternal Grandmother    Heart disease Paternal Grandmother    Healthy Sister    Healthy Brother    Healthy Brother    Alzheimer's disease Maternal Aunt     Social history: She lives in a home that is 31+ years old without carpeting with gas heating and central cooling.  No pets in the home.  There is no concern for water damage, mildew or roaches in the home.  She is a licensed Pharmacist, hospital.  She denies a smoking history.     Medication List: Current Outpatient Medications  Medication Sig Dispense Refill   ALPRAZolam (XANAX) 0.25 MG tablet Take 1 tablet (0.25 mg total) by mouth daily as needed for anxiety. 30 tablet 0   amLODipine (NORVASC) 10 MG tablet Take 10 mg by mouth daily.     busPIRone (BUSPAR) 15 MG tablet Take 0.5 tablets (7.5 mg total) by mouth 2 (two) times daily. 60 tablet 2   metoprolol tartrate (LOPRESSOR) 25 MG tablet Take 1 tablet (25 mg total) by mouth daily. 30 tablet 0   Multiple Vitamin (MULTIVITAMIN) capsule Take 1 capsule by mouth daily.     No current facility-administered medications for this visit.    Known medication allergies: Allergies  Allergen Reactions   Sulfa Antibiotics     Other reaction(s): Other (See Comments) Other Reaction: OTHER  REACTION     Physical examination: Blood pressure (!) 142/90, pulse 80, temperature 98 F (36.7 C), temperature source Temporal, resp. rate 16, height 5' 2.5" (1.588 m), weight 218 lb 2 oz (98.9 kg), SpO2 100 %.  General: Alert, interactive, in no acute distress. HEENT: PERRLA, TMs pearly gray, turbinates moderately edematous without discharge, post-pharynx non erythematous. Neck: Supple without lymphadenopathy. Lungs: Clear to auscultation without wheezing, rhonchi or rales. {no increased work of breathing. CV: Normal S1, S2 without murmurs. Abdomen: Nondistended, nontender. Skin: Warm and dry, without lesions or rashes. Extremities:  No clubbing, cyanosis or edema. Neuro:   Grossly intact.  Diagnositics/Labs:  Allergy testing: environmental allergy skin prick testing is positive rough pigweed and penicillium   Allergy testing results were read and interpreted by provider, documented by clinical staff.   Assessment and plan: Allergic rhinitis Headache  -Environmental allergy testing is positive to mold (penicillium) and rough pigweed (weed pollen) -Allergen avoidance measures discussed -Continue Zyrtec 10 mg daily as needed.  Other alternate options would be Allegra or Xyzal which are also over-the-counter -Reserve Zyrtec-D or any antihistamine with a decongestant component for issues like sinus infections to use for a short period of time for congestion.  Decongestant medications can impact blood pressure control -for nasal congestion/sinus congestion recommend use of Flonase 2 sprays each nostril daily for 1-2 weeks at a time before stopping once nasal congestion improves for maximum benefit -if medication management is not effective enough then allergen immunotherapy (allergy injections) may be helpful to control your symptoms  Follow-up in 6 months or sooner if needed  I appreciate the opportunity to take part in Keandra's care. Please do not hesitate to contact me with  questions.  Sincerely,   Margo Aye, MD Allergy/Immunology [facility]

## 2021-06-02 NOTE — Patient Instructions (Addendum)
-  Environmental allergy testing is positive to mold (penicillium) and rough pigweed (weed pollen) -Allergen avoidance measures discussed -Continue Zyrtec 10 mg daily as needed.  Other alternate options would be Allegra or Xyzal which are also over-the-counter -Reserve Zyrtec-D or any antihistamine with a decongestant component for issues like sinus infections to use for a short period of time for congestion.  Decongestant medications can impact blood pressure control -for nasal congestion/sinus congestion recommend use of Flonase 2 sprays each nostril daily for 1-2 weeks at a time before stopping once nasal congestion improves for maximum benefit -if medication management is not effective enough then allergen immunotherapy (allergy injections) may be helpful to control your symptoms  Follow-up in 6 months or sooner if needed

## 2021-06-22 ENCOUNTER — Ambulatory Visit (INDEPENDENT_AMBULATORY_CARE_PROVIDER_SITE_OTHER): Payer: 59 | Admitting: Family Medicine

## 2021-06-22 ENCOUNTER — Other Ambulatory Visit: Payer: Self-pay

## 2021-06-22 ENCOUNTER — Encounter: Payer: Self-pay | Admitting: Family Medicine

## 2021-06-22 VITALS — BP 130/80 | HR 93 | Temp 97.5°F | Ht 62.5 in | Wt 220.8 lb

## 2021-06-22 DIAGNOSIS — F411 Generalized anxiety disorder: Secondary | ICD-10-CM | POA: Diagnosis not present

## 2021-06-22 DIAGNOSIS — I10 Essential (primary) hypertension: Secondary | ICD-10-CM | POA: Diagnosis not present

## 2021-06-22 MED ORDER — AMLODIPINE BESYLATE 2.5 MG PO TABS: 5.0000 mg | ORAL_TABLET | Freq: Every day | ORAL | 1 refills | Status: DC

## 2021-06-22 NOTE — Patient Instructions (Signed)
*  ok to stop the metoprolol. If your pressures are regularly over 135/85 at home when you check, then add in another amlodipine 2.5mg  to total 7.5mg  daily. Let me know if you get swelling with this.   *start the buspar. Let me know how this goes! Would be ok to take xanax if needed.

## 2021-06-22 NOTE — Progress Notes (Signed)
Debra Wagner DOB: 1971/05/30 Encounter date: 06/22/2021  This is a 50 y.o. female who presents with Chief Complaint  Patient presents with   Follow-up    History of present illness:  *feeling better, but still with anxiety. She is working out and feels good when she is in the gym. Feels like weight is a battle for her. Started cycle yesterday and this is first one since may.  *stopped checking bp regularly at home; was making her more anxious. Usually 130/70's.  *she spoke with cardiologist and she feels she doesn't need toprol. She was getting heart rate down into 40's, so had to do half tab. Usually running 60-70 now at home. She is taking metoprolol 12.5mg  sustained release. My recheck bp: 130/80 and HR 55  She is taking 5mg  total of the amlodipine daily.   Gained a lot of weight with sertraline in the past although anxiety did very well on it. She did not start the buspar; she was just nervous about this.  She was worried about her blood sugar and is trying to keep eating and exercise in check.   Getting some headaches - does check bp when she gets headaches. Saw allergy doc- told her she has some increased sinus pressure that is contributing to headaches.   Allergies  Allergen Reactions   Sulfa Antibiotics     Other reaction(s): Other (See Comments) Other Reaction: OTHER REACTION   Current Meds  Medication Sig   ALPRAZolam (XANAX) 0.25 MG tablet Take 1 tablet (0.25 mg total) by mouth daily as needed for anxiety.   busPIRone (BUSPAR) 15 MG tablet Take 0.5 tablets (7.5 mg total) by mouth 2 (two) times daily.   cetirizine (ZYRTEC) 10 MG tablet Take 1 tablet (10 mg total) by mouth daily.   fluticasone (FLONASE) 50 MCG/ACT nasal spray Place 2 sprays into both nostrils daily.   metoprolol tartrate (LOPRESSOR) 25 MG tablet Take 1 tablet (25 mg total) by mouth daily.   Multiple Vitamin (MULTIVITAMIN) capsule Take 1 capsule by mouth daily.   [DISCONTINUED] amLODipine  (NORVASC) 10 MG tablet Take 10 mg by mouth daily.    Review of Systems  Constitutional:  Negative for chills, fatigue and fever.  Respiratory:  Negative for cough, chest tightness, shortness of breath and wheezing.   Cardiovascular:  Negative for chest pain, palpitations and leg swelling.  Psychiatric/Behavioral:  The patient is nervous/anxious.    Objective:  BP 130/80   Pulse 93   Temp (!) 97.5 F (36.4 C) (Oral)   Ht 5' 2.5" (1.588 m)   Wt 220 lb 12.8 oz (100.2 kg)   BMI 39.74 kg/m   Weight: 220 lb 12.8 oz (100.2 kg)   BP Readings from Last 3 Encounters:  06/22/21 130/80  06/02/21 (!) 142/90  05/23/21 (!) 150/92   Wt Readings from Last 3 Encounters:  06/22/21 220 lb 12.8 oz (100.2 kg)  06/02/21 218 lb 2 oz (98.9 kg)  05/23/21 220 lb 1.6 oz (99.8 kg)    Physical Exam Constitutional:      General: She is not in acute distress.    Appearance: She is well-developed.  Cardiovascular:     Rate and Rhythm: Normal rate and regular rhythm.     Heart sounds: Normal heart sounds. No murmur heard.   No friction rub.  Pulmonary:     Effort: Pulmonary effort is normal. No respiratory distress.     Breath sounds: Normal breath sounds. No wheezing or rales.  Musculoskeletal:  Right lower leg: No edema.     Left lower leg: No edema.  Neurological:     Mental Status: She is alert and oriented to person, place, and time.  Psychiatric:        Behavior: Behavior normal.    Assessment/Plan  1. Primary hypertension Improved control. Continue to monitor at home. She prefers not to be on many medications and would like to stop metoprolol. Ok to stop and monitor bp at home. Increase amlodipine to 7.5mg  if regularly seeing bp at home over 135/85.   2. GAD (generalized anxiety disorder) Re-discussed buspar. She will try this. She really wishes to avoid weight gain with sertraline although this worked well for her in the past. She is planning to get a therapist which should also  help. She feels great with exercise; encouraged her to work in an evening walk to help calm anxiety.     Return for keep follow up visit.   33 minutes spent with patient discussing medications for anxiety; potential side effects, bp medications and side effects. Treatment goals for blood pressure.    Theodis Shove, MD

## 2021-06-29 ENCOUNTER — Ambulatory Visit: Payer: 59 | Admitting: Family Medicine

## 2021-07-05 ENCOUNTER — Telehealth: Payer: Self-pay | Admitting: Family Medicine

## 2021-07-05 NOTE — Telephone Encounter (Signed)
Patient is requesting for amLODipine (NORVASC) 2.5 MG tablet [[id number]]  and ALPRAZolam (XANAX) 0.25 MG tablet [[id number]] to be sent to her pharmacy.  Pharmacy is [id number] - [place], [place] - [address] AT [facility] [address]  [address], [place] [address]   Patient would like to be contacted at [phone] when completed.  Please advise.

## 2021-07-06 ENCOUNTER — Other Ambulatory Visit: Payer: Self-pay | Admitting: Family Medicine

## 2021-07-06 DIAGNOSIS — F411 Generalized anxiety disorder: Secondary | ICD-10-CM

## 2021-07-06 MED ORDER — AMLODIPINE BESYLATE 5 MG PO TABS: 5.0000 mg | ORAL_TABLET | Freq: Every day | ORAL | 0 refills | Status: DC

## 2021-07-06 MED ORDER — ALPRAZOLAM 0.25 MG PO TABS: 0.2500 mg | ORAL_TABLET | Freq: Every day | ORAL | 0 refills | Status: AC | PRN

## 2021-07-06 NOTE — Telephone Encounter (Signed)
Patient informed of the message below.  Patient stated she has not been taking 7.5mg  for 2 weeks and is aware the Rx for 5mg  was sent in as below.

## 2021-07-06 NOTE — Telephone Encounter (Signed)
Xanax was sent in. Please check with her to see current amlodipine dosing. At last visit she was instructed to increase to 7.5mg  daily amlodipine if home bp were over 135/85. Need to know what dose she is taking and how home pressures look so I can send in right amount.

## 2021-07-06 NOTE — Telephone Encounter (Signed)
Patient informed of the message below.  Patient stated she is currently taking Amlodipine 7.5mg  daily, needs to have the Rx sent for 5mg  as she has been taking 2.5mg  to equal 7.5mg .  Patient stated blood pressures have been ranging from 140-150's systolic and 80s diastolic.  Message sent to PCP.

## 2021-07-06 NOTE — Telephone Encounter (Signed)
If still in 140-150 systolic range, I would suggest increasing amlodipine to 10mg  daily (assuming she has done the 7.5mg  for at least 2 weeks). If she is ok with that, please send in 10mg . If she hasn't done the 7.5mg  for 2 weeks, then ok to send in 5mg  for her to take with the 2.5mg  she already has and have her keep checking at home and update in 2 weeks time. Lower leg swelling is biggest risk with increased dose of amlodipine.

## 2021-07-11 ENCOUNTER — Other Ambulatory Visit: Payer: Self-pay

## 2021-07-11 ENCOUNTER — Other Ambulatory Visit (INDEPENDENT_AMBULATORY_CARE_PROVIDER_SITE_OTHER): Payer: 59

## 2021-07-11 DIAGNOSIS — E559 Vitamin D deficiency, unspecified: Secondary | ICD-10-CM | POA: Diagnosis not present

## 2021-07-11 DIAGNOSIS — E785 Hyperlipidemia, unspecified: Secondary | ICD-10-CM | POA: Diagnosis not present

## 2021-07-11 DIAGNOSIS — R002 Palpitations: Secondary | ICD-10-CM

## 2021-07-11 DIAGNOSIS — R7301 Impaired fasting glucose: Secondary | ICD-10-CM | POA: Diagnosis not present

## 2021-07-11 DIAGNOSIS — R030 Elevated blood-pressure reading, without diagnosis of hypertension: Secondary | ICD-10-CM | POA: Diagnosis not present

## 2021-07-11 LAB — LIPID PANEL
Cholesterol: 177 mg/dL (ref 0–200)
HDL: 45.7 mg/dL (ref >39.00)
LDL Cholesterol: 123 mg/dL — ABNORMAL HIGH (ref 0–99)
NonHDL: 131.4
Total CHOL/HDL Ratio: 4
Triglycerides: 41 mg/dL (ref 0.0–149.0)
VLDL: 8.2 mg/dL (ref 0.0–40.0)

## 2021-07-11 LAB — CBC WITH DIFFERENTIAL/PLATELET
Basophils Absolute: 0 10*3/uL (ref 0.0–0.1)
Basophils Relative: 0.6 % (ref 0.0–3.0)
Eosinophils Absolute: 0.2 10*3/uL (ref 0.0–0.7)
Eosinophils Relative: 3.3 % (ref 0.0–5.0)
HCT: 38.7 % (ref 36.0–46.0)
Hemoglobin: 12.4 g/dL (ref 12.0–15.0)
Lymphocytes Relative: 40.4 % (ref 12.0–46.0)
Lymphs Abs: 2.3 10*3/uL (ref 0.7–4.0)
MCHC: 32 g/dL (ref 30.0–36.0)
MCV: 79.9 fl (ref 78.0–100.0)
Monocytes Absolute: 0.6 10*3/uL (ref 0.1–1.0)
Monocytes Relative: 10.7 % (ref 3.0–12.0)
Neutro Abs: 2.6 10*3/uL (ref 1.4–7.7)
Neutrophils Relative %: 45 % (ref 43.0–77.0)
Platelets: 254 10*3/uL (ref 150.0–400.0)
RBC: 4.84 Mil/uL (ref 3.87–5.11)
RDW: 15 % (ref 11.5–15.5)
WBC: 5.7 10*3/uL (ref 4.0–10.5)

## 2021-07-11 LAB — COMPREHENSIVE METABOLIC PANEL
ALT: 15 U/L (ref 0–35)
AST: 19 U/L (ref 0–37)
Albumin: 4.1 g/dL (ref 3.5–5.2)
Alkaline Phosphatase: 93 U/L (ref 39–117)
BUN: 12 mg/dL (ref 6–23)
CO2: 27 mEq/L (ref 19–32)
Calcium: 9 mg/dL (ref 8.4–10.5)
Chloride: 105 mEq/L (ref 96–112)
Creatinine, Ser: 0.74 mg/dL (ref 0.40–1.20)
GFR: 95.6 mL/min (ref >60.00)
Glucose, Bld: 102 mg/dL — ABNORMAL HIGH (ref 70–99)
Potassium: 4.2 mEq/L (ref 3.5–5.1)
Sodium: 138 mEq/L (ref 135–145)
Total Bilirubin: 0.3 mg/dL (ref 0.2–1.2)
Total Protein: 7.7 g/dL (ref 6.0–8.3)

## 2021-07-11 LAB — VITAMIN D 25 HYDROXY (VIT D DEFICIENCY, FRACTURES): VITD: 25.33 ng/mL — ABNORMAL LOW (ref 30.00–100.00)

## 2021-07-11 LAB — HEMOGLOBIN A1C: Hgb A1c MFr Bld: 6 % (ref 4.6–6.5)

## 2021-07-11 LAB — TSH: TSH: 3.08 u[IU]/mL (ref 0.35–5.50)

## 2021-07-14 ENCOUNTER — Other Ambulatory Visit: Payer: Self-pay

## 2021-07-14 ENCOUNTER — Ambulatory Visit
Admission: RE | Admit: 2021-07-14 | Discharge: 2021-07-14 | Disposition: A | Discharge status: Home / Self Care | Payer: 59 | Source: Ambulatory Visit | Attending: Obstetrics & Gynecology | Admitting: Obstetrics & Gynecology

## 2021-07-14 DIAGNOSIS — N6489 Other specified disorders of breast: Secondary | ICD-10-CM

## 2021-07-15 ENCOUNTER — Encounter: Payer: Self-pay | Admitting: Family Medicine

## 2021-07-15 ENCOUNTER — Ambulatory Visit (INDEPENDENT_AMBULATORY_CARE_PROVIDER_SITE_OTHER): Payer: 59 | Admitting: Family Medicine

## 2021-07-15 VITALS — BP 118/78 | HR 77 | Temp 98.1°F | Ht 62.5 in | Wt 219.1 lb

## 2021-07-15 DIAGNOSIS — J302 Other seasonal allergic rhinitis: Secondary | ICD-10-CM

## 2021-07-15 DIAGNOSIS — I10 Essential (primary) hypertension: Secondary | ICD-10-CM | POA: Diagnosis not present

## 2021-07-15 DIAGNOSIS — F419 Anxiety disorder, unspecified: Secondary | ICD-10-CM | POA: Diagnosis not present

## 2021-07-15 MED ORDER — SERTRALINE HCL 25 MG PO TABS: 25.0000 mg | ORAL_TABLET | Freq: Every day | ORAL | 1 refills | Status: DC

## 2021-07-15 MED ORDER — AMLODIPINE BESYLATE 2.5 MG PO TABS: 2.5000 mg | ORAL_TABLET | Freq: Every day | ORAL | 1 refills | Status: DC

## 2021-07-15 NOTE — Progress Notes (Signed)
Debra Wagner DOB: 01/26/1971 Encounter date: 07/15/2021  This is a 50 y.o. female who presents with Chief Complaint  Patient presents with   Follow-up    History of present illness:  She is worried about blood pressure: surprised by systolic. At home has been elevated - gets headache with this. Headache that stays and usually at home seeing around 140.   She did take the buspar and didn't feel like it did anything for her. Went up to 15mg  BID but not noting improvement. She did find aunt passed away 2 weeks ago, so this was stressful event for her.   Still taking the toprol; sometimes feels like HR is a little lower. Now stays around 60.   We had avoided zoloft due to concerns with weight gain she had before. She isn't sure that it was sole cause. Did have some loss of appetite when she started zoloft in the past, also had some insomnia, had some dry mouth.    Allergies  Allergen Reactions   Sulfa Antibiotics     Other reaction(s): Other (See Comments) Other Reaction: OTHER REACTION   Current Meds  Medication Sig   ALPRAZolam (XANAX) 0.25 MG tablet Take 1 tablet (0.25 mg total) by mouth daily as needed for anxiety.   amLODipine (NORVASC) 5 MG tablet Take 1 tablet (5 mg total) by mouth daily.   cetirizine (ZYRTEC) 10 MG tablet Take 1 tablet (10 mg total) by mouth daily.   fluticasone (FLONASE) 50 MCG/ACT nasal spray Place 2 sprays into both nostrils daily.   Multiple Vitamin (MULTIVITAMIN) capsule Take 1 capsule by mouth daily.   sertraline (ZOLOFT) 25 MG tablet Take 1 tablet (25 mg total) by mouth daily.   [DISCONTINUED] amLODipine (NORVASC) 2.5 MG tablet Take 2 tablets (5 mg total) by mouth daily.   [DISCONTINUED] busPIRone (BUSPAR) 15 MG tablet Take 0.5 tablets (7.5 mg total) by mouth 2 (two) times daily.   [DISCONTINUED] metoprolol tartrate (LOPRESSOR) 25 MG tablet Take 1 tablet (25 mg total) by mouth daily.    Review of Systems  Constitutional:  Negative for chills,  fatigue and fever.  Respiratory:  Negative for cough, chest tightness, shortness of breath and wheezing.   Cardiovascular:  Negative for chest pain, palpitations and leg swelling.   Objective:  BP 118/78   Pulse 77   Temp 98.1 F (36.7 C) (Oral)   Ht 5' 2.5" (1.588 m)   Wt 219 lb 1.6 oz (99.4 kg)   BMI 39.44 kg/m   Weight: 219 lb 1.6 oz (99.4 kg)   BP Readings from Last 3 Encounters:  07/15/21 118/78  06/22/21 130/80  06/02/21 (!) 142/90   Wt Readings from Last 3 Encounters:  07/15/21 219 lb 1.6 oz (99.4 kg)  06/22/21 220 lb 12.8 oz (100.2 kg)  06/02/21 218 lb 2 oz (98.9 kg)    Physical Exam Constitutional:      General: She is not in acute distress.    Appearance: She is well-developed.  HENT:     Nose:     Right Turbinates: Enlarged, swollen and pale.     Left Turbinates: Enlarged, swollen and pale.  Cardiovascular:     Rate and Rhythm: Normal rate and regular rhythm.     Heart sounds: Normal heart sounds. No murmur heard.   No friction rub.  Pulmonary:     Effort: Pulmonary effort is normal. No respiratory distress.     Breath sounds: Normal breath sounds. No wheezing or rales.  Musculoskeletal:  Right lower leg: No edema.     Left lower leg: No edema.  Neurological:     Mental Status: She is alert and oriented to person, place, and time.  Psychiatric:        Behavior: Behavior normal.    Assessment/Plan  1. Anxiety Stop the buspar and start the zoloft 25mg  daily. She has done well with this in the past. We will watch the weight gain with this.   2. Hypertension, unspecified type Well controlled with this today! Continue with amlodipine 7.5mg  daily and metoprolol 12.5mg  succinate daily.   3. Seasonal allergies Start flonase.   Return in about 1 month (around 08/15/2021) for Chronic condition visit.   08/17/2021, MD

## 2021-07-15 NOTE — Patient Instructions (Signed)
Would be ok to just stop the buspar if desired; or can taper back to 1/2 tablet twice daily for 3 days, then 1/2 tablet daily for 3 days, then stop.

## 2021-08-22 ENCOUNTER — Ambulatory Visit (INDEPENDENT_AMBULATORY_CARE_PROVIDER_SITE_OTHER): Payer: 59 | Admitting: Family Medicine

## 2021-08-22 ENCOUNTER — Encounter: Payer: Self-pay | Admitting: Family Medicine

## 2021-08-22 VITALS — BP 118/84 | HR 79 | Temp 98.5°F | Ht 62.5 in | Wt 216.2 lb

## 2021-08-22 DIAGNOSIS — F411 Generalized anxiety disorder: Secondary | ICD-10-CM | POA: Diagnosis not present

## 2021-08-22 DIAGNOSIS — I10 Essential (primary) hypertension: Secondary | ICD-10-CM | POA: Diagnosis not present

## 2021-08-22 MED ORDER — SERTRALINE HCL 50 MG PO TABS
50.0000 mg | ORAL_TABLET | Freq: Every day | ORAL | 1 refills | Status: DC
Start: 2021-08-22 — End: 2021-11-30

## 2021-08-22 NOTE — Patient Instructions (Signed)
Stop regularly checking bp. You can check if you don't feel well; but if using the same cuff you have now, then subtract about 25 points from your top number.

## 2021-08-22 NOTE — Progress Notes (Signed)
Debra Wagner DOB: 1971/01/26 Encounter date: 08/22/2021  This is a 50 y.o. female who presents with Chief Complaint  Patient presents with   Follow-up     History of present illness: Last visit was 1 month ago.  We have been seeing her regularly for anxiety.  She was trying to avoid SSRIs due to worry of weight gain, but anxiety was not controlled on BuSpar.  She had had good success with Zoloft in the past and we restarted Zoloft 25 mg daily at her last visit. Still having some little bits of anxiety, but not as good as she would like to be.   Has been working out, trying to eat right but doing this in moderation without stressing about it. At least 3 days/week.   Had some loss of appetite when starting zoloft; felt a little detached. Did have some upset stomach. Taking medication in the morning.   Blood pressure was well controlled last visit, we continued amlodipine 7.5 mg daily metoprolol 2.5 mg daily. Still running high at home. Home cuff read 137/67. States that she hasn't had a decent reading since leaving here last visit. Did have cardiology visit - bp was ok at that visit as well. Toprol is stopped and amlodipine was decreased to 5mg . 138/70 on her recheck. My recheck was 110/70. Hasn't had headache for awhile. She feels like she can tell when pressure is up. Cuff is too small for her (right on edge of size measurement).       Allergies  Allergen Reactions   Sulfa Antibiotics     Other reaction(s): Other (See Comments) Other Reaction: OTHER REACTION   Current Meds  Medication Sig   ALPRAZolam (XANAX) 0.25 MG tablet Take 1 tablet (0.25 mg total) by mouth daily as needed for anxiety.   amLODipine (NORVASC) 5 MG tablet Take 1 tablet (5 mg total) by mouth daily.   cetirizine (ZYRTEC) 10 MG tablet Take 1 tablet (10 mg total) by mouth daily.   fluticasone (FLONASE) 50 MCG/ACT nasal spray Place 2 sprays into both nostrils daily.   losartan (COZAAR) 25 MG tablet Take 25 mg by  mouth daily.   Multiple Vitamin (MULTIVITAMIN) capsule Take 1 capsule by mouth daily.   sertraline (ZOLOFT) 50 MG tablet Take 1 tablet (50 mg total) by mouth daily.   [DISCONTINUED] amLODipine (NORVASC) 2.5 MG tablet Take 1 tablet (2.5 mg total) by mouth daily.   [DISCONTINUED] sertraline (ZOLOFT) 25 MG tablet Take 1 tablet (25 mg total) by mouth daily.    Review of Systems  Constitutional:  Negative for chills, fatigue and fever.  Respiratory:  Negative for cough, chest tightness, shortness of breath and wheezing.   Cardiovascular:  Negative for chest pain, palpitations and leg swelling.   Objective:  BP 118/84 (BP Location: Left Arm, Patient Position: Sitting, Cuff Size: Large)   Pulse 79   Temp 98.5 F (36.9 C) (Oral)   Ht 5' 2.5" (1.588 m)   Wt 216 lb 3.2 oz (98.1 kg)   LMP 08/15/2021 (Exact Date)   SpO2 97%   BMI 38.91 kg/m   Weight: 216 lb 3.2 oz (98.1 kg)   BP Readings from Last 3 Encounters:  08/22/21 118/84  07/15/21 118/78  06/22/21 130/80   Wt Readings from Last 3 Encounters:  08/22/21 216 lb 3.2 oz (98.1 kg)  07/15/21 219 lb 1.6 oz (99.4 kg)  06/22/21 220 lb 12.8 oz (100.2 kg)    Physical Exam Constitutional:      General:  She is not in acute distress.    Appearance: She is well-developed.  Cardiovascular:     Rate and Rhythm: Normal rate and regular rhythm.     Heart sounds: Normal heart sounds. No murmur heard.   No friction rub.  Pulmonary:     Effort: Pulmonary effort is normal. No respiratory distress.     Breath sounds: Normal breath sounds. No wheezing or rales.  Musculoskeletal:     Right lower leg: No edema.     Left lower leg: No edema.  Neurological:     Mental Status: She is alert and oriented to person, place, and time.  Psychiatric:        Behavior: Behavior normal.    Assessment/Plan  1. Primary hypertension Bp is well controlled. Home cuff is over reading (cuff small) by about 25 points. Checking bp at home increases anxiety, so I  advised her to limit this.   2. GAD (generalized anxiety disorder) Increase zoloft to 50mg . Hopefully this will better control overall anxiety. She would like to keep follow up close.   Return in about 2 months (around 10/22/2021) for Chronic condition visit.      10/24/2021, MD

## 2021-09-16 ENCOUNTER — Other Ambulatory Visit: Payer: Self-pay | Admitting: Family Medicine

## 2021-09-30 ENCOUNTER — Telehealth: Payer: Self-pay | Admitting: Family Medicine

## 2021-09-30 NOTE — Telephone Encounter (Signed)
Patient calling in with respiratory symptoms: Shortness of breath, chest pain, palpitations or other red words send to Triage  Does the patient have a fever over 100, cough, congestion, sore throat, runny nose, lost of taste/smell (please list symptoms that patient has)?Headache, ear ache and scratchy throat  What date did symptoms start?12/26 (If over 5 days ago, pt may be scheduled for in person visit)  Have you tested for Covid in the last 5 days? Yes   If yes, was it positive OR negative [x] ? If positive in the last 5 days, please schedule virtual visit now. If negative, schedule for an in person OV with the next available provider if PCP has no openings. Please also let patient know they will be tested again (follow the script below)  "you will have to arrive prior to your appt time to be Covid tested. Please park in back of office at the cone & call [phone] to let the staff know you have arrived. A staff member will meet you at your car to do a rapid covid test. Once the test has resulted you will be notified by phone of your results to determine if appt will remain an in person visit or be converted to a virtual/phone visit. If you arrive [phone] than [phone] before your appt time, your visit will be automatically converted to virtual & any recommended testing will happen AFTER the visit."   THINGS TO REMEMBER  If no availability for virtual visit in office,  please schedule another [place] office  If no availability at another [place] office, please instruct patient that they can schedule an evisit or virtual visit through their mychart account. Visits up to 8pm  patients can be seen in office 5 days after positive COVID test

## 2021-10-05 ENCOUNTER — Ambulatory Visit (INDEPENDENT_AMBULATORY_CARE_PROVIDER_SITE_OTHER): Payer: Managed Care, Other (non HMO) | Admitting: Family Medicine

## 2021-10-05 ENCOUNTER — Encounter: Payer: Self-pay | Admitting: Family Medicine

## 2021-10-05 VITALS — BP 128/78 | HR 82 | Temp 98.6°F | Ht 62.5 in | Wt 219.7 lb

## 2021-10-05 DIAGNOSIS — J3489 Other specified disorders of nose and nasal sinuses: Secondary | ICD-10-CM

## 2021-10-05 DIAGNOSIS — Z6838 Body mass index (BMI) 38.0-38.9, adult: Secondary | ICD-10-CM

## 2021-10-05 DIAGNOSIS — R7303 Prediabetes: Secondary | ICD-10-CM | POA: Diagnosis not present

## 2021-10-05 MED ORDER — OZEMPIC (0.25 OR 0.5 MG/DOSE) 2 MG/1.5ML ~~LOC~~ SOPN: 0.2500 mg | PEN_INJECTOR | SUBCUTANEOUS | 1 refills | Status: DC

## 2021-10-05 MED ORDER — DOXYCYCLINE HYCLATE 100 MG PO TABS: 100.0000 mg | ORAL_TABLET | Freq: Two times a day (BID) | ORAL | 0 refills | Status: AC

## 2021-10-05 NOTE — Patient Instructions (Addendum)
If you are regularly less than 65 diastolic (bottom number) or 110 systolic (top number).   Go to the [employer] and print a savings card before you pick up your prescription at the pharmacy.  You can take plain mucinex (guaifenesin) to help with congestion.

## 2021-10-05 NOTE — Progress Notes (Signed)
Debra Wagner DOB: [birth year] Encounter date: 10/05/2021  This is a 51 y.o. female who presents with Chief Complaint  Patient presents with   Ear Pain    Patient complains of bilateral ear pain and congestion x1 week, states the home Covid test was negative 4 days   patient complains of head congestion x1 week    History of present illness: Blood pressure has been running good at home. This morning was really low.   Wants to talk about weight loss. She has been doing research on shot, ozempic and is interested. Tends to overeat. Sometimes emotional eater. Weight yo-yo's a lot. Then falls off healthy eating and hard to get back on track.   Fullness between ears, sinuses. Flonase helps some.   Last covid test was on Saturday. Started feeling bad about a week and half ago.had some intermittent headaches. More pressure than ear pain. No significant cough.   Was exposed to covid on 12/23 at [facility] party and was called from 4 different people testing positive after that.    Allergies  Allergen Reactions   Sulfa Antibiotics     Other reaction(s): Other (See Comments) Other Reaction: OTHER REACTION   Current Meds  Medication Sig   ALPRAZolam (XANAX) 0.25 MG tablet Take 1 tablet (0.25 mg total) by mouth daily as needed for anxiety.   amLODipine (NORVASC) 5 MG tablet TAKE 1 TABLET(5 MG) BY MOUTH DAILY   cetirizine (ZYRTEC) 10 MG tablet Take 1 tablet (10 mg total) by mouth daily.   doxycycline (VIBRA-TABS) 100 MG tablet Take 1 tablet (100 mg total) by mouth 2 (two) times daily for 7 days.   fluticasone (FLONASE) 50 MCG/ACT nasal spray Place 2 sprays into both nostrils daily.   losartan (COZAAR) 25 MG tablet Take 25 mg by mouth daily.   Multiple Vitamin (MULTIVITAMIN) capsule Take 1 capsule by mouth daily.   sertraline (ZOLOFT) 50 MG tablet Take 1 tablet (50 mg total) by mouth daily.   [DISCONTINUED] Semaglutide,0.25 or 0.5MG /DOS, (OZEMPIC, 0.25 OR 0.5 MG/DOSE,) 2 MG/1.5ML SOPN Inject  0.25 mg into the skin once a week.    Review of Systems  Constitutional:  Positive for fatigue (increased last night). Negative for chills and fever.  HENT:  Positive for congestion, postnasal drip, sinus pressure, sinus pain and sore throat. Negative for ear pain.   Respiratory:  Negative for cough, shortness of breath and wheezing.   Cardiovascular:  Negative for chest pain.  Neurological:  Positive for headaches.   Objective:  BP 128/78 (BP Location: Left Arm, Patient Position: Sitting, Cuff Size: Large)    Pulse 82    Temp 98.6 F (37 C) (Oral)    Ht 5' 2.5" (1.588 m)    Wt 219 lb 11.2 oz (99.7 kg)    LMP 07/05/2021 (Approximate)    SpO2 96%    BMI 39.54 kg/m   Weight: 219 lb 11.2 oz (99.7 kg)   BP Readings from Last 3 Encounters:  10/05/21 128/78  08/22/21 118/84  07/15/21 118/78   Wt Readings from Last 3 Encounters:  10/05/21 219 lb 11.2 oz (99.7 kg)  08/22/21 216 lb 3.2 oz (98.1 kg)  07/15/21 219 lb 1.6 oz (99.4 kg)    Physical Exam Constitutional:      General: She is not in acute distress.    Appearance: She is well-developed.  HENT:     Head: Normocephalic and atraumatic.     Right Ear: Tympanic membrane, ear canal and external ear normal.  Left Ear: Tympanic membrane, ear canal and external ear normal.     Nose: Mucosal edema present.     Right Turbinates: Enlarged and swollen.     Left Turbinates: Enlarged and swollen.     Right Sinus: Maxillary sinus tenderness and frontal sinus tenderness present.     Left Sinus: Maxillary sinus tenderness and frontal sinus tenderness present.     Mouth/Throat:     Mouth: Mucous membranes are moist.     Pharynx: Uvula midline. Posterior oropharyngeal erythema present.     Comments: Erythema oropharynx Cardiovascular:     Rate and Rhythm: Normal rate and regular rhythm.  Pulmonary:     Effort: Pulmonary effort is normal. No respiratory distress.     Breath sounds: Normal breath sounds. No wheezing, rhonchi or rales.     Assessment/Plan  1. Prediabetes She is interested in starting medication to help with sugar control and weight loss. We discussed and she is willing to try ozempic. Instructed her to get coupon online. Discussed new medication(s) today with patient. Discussed potential side effects and patient verbalized understanding.  - Semaglutide,0.25 or 0.5MG /DOS, (OZEMPIC, 0.25 OR 0.5 MG/DOSE,) 2 MG/1.5ML SOPN; Inject 0.25 mg into the skin once a week.  Dispense: 4.5 mL; Refill: 1  2. Class 2 severe obesity due to excess calories with serious comorbidity and body mass index (BMI) of 38.0 to 38.9 in adult [facility]) See above. I do feel with appetite control with ozempic she will have more success with weight loss.  - Semaglutide,0.25 or 0.5MG /DOS, (OZEMPIC, 0.25 OR 0.5 MG/DOSE,) 2 MG/1.5ML SOPN; Inject 0.25 mg into the skin once a week.  Dispense: 4.5 mL; Refill: 1  3. Sinus pressure Negative covid in office. If any worsening sinus sx in 48 hours; ok to start abx (printed doxy). Useflonase, mucinex in meanwhile. - POC COVID-19   Return in about 3 months (around 01/03/2022) for Chronic condition visit.  spent in chart review, discussion of med option and dosing for sugar control/weight loss, exam,charting.     Theodis Shove, MD

## 2021-10-06 ENCOUNTER — Telehealth: Payer: Self-pay | Admitting: *Deleted

## 2021-10-06 LAB — POC COVID19 BINAXNOW: SARS Coronavirus 2 Ag: NEGATIVE

## 2021-10-06 NOTE — Telephone Encounter (Signed)
Results were entered as below.

## 2021-10-06 NOTE — Telephone Encounter (Signed)
-----   Message from Wynn Banker, MD sent at 10/05/2021  7:59 PM EST ----- Her covid test was negative.idon't remember how to document this.can you help so I can close her note? Thanks!

## 2021-10-07 ENCOUNTER — Encounter: Payer: Self-pay | Admitting: Family Medicine

## 2021-10-07 DIAGNOSIS — R7301 Impaired fasting glucose: Secondary | ICD-10-CM

## 2021-10-13 ENCOUNTER — Encounter: Payer: Self-pay | Admitting: Family Medicine

## 2021-10-13 DIAGNOSIS — R7301 Impaired fasting glucose: Secondary | ICD-10-CM | POA: Insufficient documentation

## 2021-10-19 NOTE — Telephone Encounter (Signed)
Ozempic PA denied: "there is nothing to support that for each of the following 2 covered alternatives: Byetta or Bydureon, trulicity"  Per Dr Hassan Rowan: since last A1c not diabetic, suggest she try similar med that is indicted for wt loss: either [place] (weekly shot) or Saxenda (daily shot). Both should help with weight loss/appetite. Let me know preference. Coverage may still be an issue be we can try."  Pt notified of PCP response. Pt states she would probably lean more toward the weekly shot, Wegovy but would like more information about it. She would like to know the side effects of Wegovy. Pt encouraged to look it up, but would like to know what PCP says.

## 2021-10-22 MED ORDER — SEMAGLUTIDE-WEIGHT MANAGEMENT 2.4 MG/0.75ML ~~LOC~~ SOAJ: 2.4000 mg | SUBCUTANEOUS | 2 refills | Status: DC

## 2021-10-22 MED ORDER — SEMAGLUTIDE-WEIGHT MANAGEMENT 0.5 MG/0.5ML ~~LOC~~ SOAJ: 0.5000 mg | SUBCUTANEOUS | 0 refills | Status: AC

## 2021-10-22 MED ORDER — SEMAGLUTIDE-WEIGHT MANAGEMENT 1.7 MG/0.75ML ~~LOC~~ SOAJ: 1.7000 mg | SUBCUTANEOUS | 0 refills | Status: DC

## 2021-10-22 MED ORDER — SEMAGLUTIDE-WEIGHT MANAGEMENT 0.25 MG/0.5ML ~~LOC~~ SOAJ: 0.2500 mg | SUBCUTANEOUS | 0 refills | Status: AC

## 2021-10-22 MED ORDER — SEMAGLUTIDE-WEIGHT MANAGEMENT 1 MG/0.5ML ~~LOC~~ SOAJ: 1.0000 mg | SUBCUTANEOUS | 0 refills | Status: DC

## 2021-10-22 NOTE — Addendum Note (Signed)
Addended by: Caren Macadam on: 10/22/2021 11:33 AM   Modules accepted: Orders

## 2021-10-24 ENCOUNTER — Ambulatory Visit: Payer: 59 | Admitting: Family Medicine

## 2021-11-02 ENCOUNTER — Encounter: Payer: Self-pay | Admitting: Family Medicine

## 2021-11-29 LAB — HM PAP SMEAR: HPV 16/18/45 genotyping: NEGATIVE

## 2021-11-30 ENCOUNTER — Other Ambulatory Visit: Payer: Self-pay | Admitting: *Deleted

## 2021-11-30 MED ORDER — SERTRALINE HCL 50 MG PO TABS: 50.0000 mg | ORAL_TABLET | Freq: Every day | ORAL | 1 refills | Status: DC

## 2021-11-30 NOTE — Telephone Encounter (Signed)
Rx done. 

## 2021-12-01 ENCOUNTER — Ambulatory Visit: Payer: 59 | Admitting: Allergy

## 2021-12-06 ENCOUNTER — Other Ambulatory Visit: Payer: Self-pay | Admitting: *Deleted

## 2021-12-06 MED ORDER — AMLODIPINE BESYLATE 5 MG PO TABS: ORAL_TABLET | ORAL | 0 refills | Status: DC

## 2021-12-06 NOTE — Telephone Encounter (Signed)
Rx done. 

## 2021-12-12 ENCOUNTER — Other Ambulatory Visit: Payer: Self-pay | Admitting: Family Medicine

## 2022-01-04 ENCOUNTER — Encounter: Payer: Self-pay | Admitting: Family Medicine

## 2022-01-04 ENCOUNTER — Ambulatory Visit (INDEPENDENT_AMBULATORY_CARE_PROVIDER_SITE_OTHER): Payer: Managed Care, Other (non HMO) | Admitting: Family Medicine

## 2022-01-04 VITALS — BP 104/80 | HR 75 | Temp 98.4°F | Ht 62.5 in | Wt 217.5 lb

## 2022-01-04 DIAGNOSIS — I10 Essential (primary) hypertension: Secondary | ICD-10-CM | POA: Diagnosis not present

## 2022-01-04 DIAGNOSIS — Z6838 Body mass index (BMI) 38.0-38.9, adult: Secondary | ICD-10-CM

## 2022-01-04 DIAGNOSIS — R7301 Impaired fasting glucose: Secondary | ICD-10-CM | POA: Diagnosis not present

## 2022-01-04 MED ORDER — WEGOVY 0.25 MG/0.5ML ~~LOC~~ SOAJ: 0.2500 mg | SUBCUTANEOUS | 2 refills | Status: DC

## 2022-01-04 NOTE — Patient Instructions (Addendum)
Can look into Plenity as this may be alternative if we can't get insurance to cover wegovy.  ? ?Let me know if you haven't heard from me in 2 weeks time regarding the wegovy/coverage.  ? ? ?

## 2022-01-04 NOTE — Progress Notes (Signed)
?Debra Wagner ?DOB: [birth year] ?Encounter date: 01/04/2022 ? ?This is a 51 y.o. female who presents with ?Chief Complaint  ?Patient presents with  ? Follow-up  ? ? ?History of present illness: ?Last visit with me is 10/05/2021. ? ?We discussed desire for weight loss at that visit as well as prediabetes sugar control.  Ozempic was prescribed. Still exercising regularly.  ? ?Has good moments where weight is down (got down to 210 at one point).  ? ?Bp has been good at home- 120/66 at home this morning. Not checking as often at home.  ? ?Hasn't taken losartan for 2 months. Just taking amlodipine 5mg .  ? ?Sometimes feels like breathing is off - but just with anxious. She can talk self out of it,but always bothers her.  ? ? ?Allergies  ?Allergen Reactions  ? Sulfa Antibiotics   ?  Other reaction(s): Other (See Comments) ?Other Reaction: OTHER REACTION  ? ?Current Meds  ?Medication Sig  ? ALPRAZolam (XANAX) 0.25 MG tablet Take 1 tablet (0.25 mg total) by mouth daily as needed for anxiety.  ? amLODipine (NORVASC) 5 MG tablet TAKE 1 TABLET(5 MG) BY MOUTH DAILY  ? cetirizine (ZYRTEC) 10 MG tablet Take 1 tablet (10 mg total) by mouth daily.  ? fluticasone (FLONASE) 50 MCG/ACT nasal spray Place 2 sprays into both nostrils daily.  ? Multiple Vitamin (MULTIVITAMIN) capsule Take 1 capsule by mouth daily.  ? Semaglutide-Weight Management (WEGOVY) 0.25 MG/0.5ML SOAJ Inject 0.25 mg into the skin once a week.  ? sertraline (ZOLOFT) 50 MG tablet Take 1 tablet (50 mg total) by mouth daily.  ? [DISCONTINUED] losartan (COZAAR) 25 MG tablet Take 25 mg by mouth daily.  ? [DISCONTINUED] Semaglutide-Weight Management 1 MG/0.5ML SOAJ Inject 1 mg into the skin once a week for 28 days.  ? [DISCONTINUED] Semaglutide-Weight Management 1.7 MG/0.75ML SOAJ Inject 1.7 mg into the skin once a week for 28 days.  ? [DISCONTINUED] Semaglutide-Weight Management 2.4 MG/0.75ML SOAJ Inject 2.4 mg into the skin once a week for 28 days.  ? ? ?Review of Systems   ?Constitutional:  Negative for chills, fatigue and fever.  ?Respiratory:  Negative for cough, chest tightness, shortness of breath and wheezing.   ?Cardiovascular:  Negative for chest pain, palpitations and leg swelling.  ? ?Objective: ? ?BP 104/80 (BP Location: Left Arm, Patient Position: Sitting, Cuff Size: Large)   Pulse 75   Temp 98.4 ?F (36.9 ?C) (Oral)   Ht 5' 2.5" (1.588 m)   Wt 217 lb 8 oz (98.7 kg)   SpO2 99%   BMI 39.15 kg/m?   Weight: 217 lb 8 oz (98.7 kg)  ? ?BP Readings from Last 3 Encounters:  ?01/04/22 104/80  ?10/05/21 128/78  ?08/22/21 118/84  ? ?Wt Readings from Last 3 Encounters:  ?01/04/22 217 lb 8 oz (98.7 kg)  ?10/05/21 219 lb 11.2 oz (99.7 kg)  ?08/22/21 216 lb 3.2 oz (98.1 kg)  ? ? ?Physical Exam ?Constitutional:   ?   General: She is not in acute distress. ?   Appearance: She is well-developed.  ?Cardiovascular:  ?   Rate and Rhythm: Normal rate and regular rhythm.  ?   Heart sounds: Normal heart sounds. No murmur heard. ?  No friction rub.  ?Pulmonary:  ?   Effort: Pulmonary effort is normal. No respiratory distress.  ?   Breath sounds: Normal breath sounds. No wheezing or rales.  ?Musculoskeletal:  ?   Right lower leg: No edema.  ?   Left lower  leg: No edema.  ?Neurological:  ?   Mental Status: She is alert and oriented to person, place, and time.  ?Psychiatric:     ?   Behavior: Behavior normal.  ? ? ?Assessment/Plan ? ?1. Primary hypertension ?Well controlled on current amlodipine; continue this.  ? ?2. Impaired fasting glucose ?Keep working on healthy eating. ?- Semaglutide-Weight Management (WEGOVY) 0.25 MG/0.5ML SOAJ; Inject 0.25 mg into the skin once a week.  Dispense: 2 mL; Refill: 2 ? ?3. Class 2 severe obesity due to excess calories with serious comorbidity and body mass index (BMI) of 38.0 to 38.9 in adult [facility]) ?Keep up with regular exercise - working with trainer, healthy eating. Will work on coverage for [employer] as I feel this would be best addition to help with  prediabetes as well as weight loss. Consider plenity if this is not covered. ?- Semaglutide-Weight Management (WEGOVY) 0.25 MG/0.5ML SOAJ; Inject 0.25 mg into the skin once a week.  Dispense: 2 mL; Refill: 2 ? ? ?Return for pending coverage for wegovy; will need to establish with Dr. Marcell Anger once she arrives.. ? ? ? ? ? ?Micheline Rough, MD ?

## 2022-02-04 ENCOUNTER — Other Ambulatory Visit: Payer: Self-pay | Admitting: Family Medicine

## 2022-02-28 ENCOUNTER — Encounter: Payer: Self-pay | Admitting: Family Medicine

## 2022-02-28 ENCOUNTER — Telehealth (INDEPENDENT_AMBULATORY_CARE_PROVIDER_SITE_OTHER): Payer: Managed Care, Other (non HMO) | Admitting: Family Medicine

## 2022-02-28 VITALS — Temp 98.9°F

## 2022-02-28 DIAGNOSIS — R059 Cough, unspecified: Secondary | ICD-10-CM

## 2022-02-28 DIAGNOSIS — R0981 Nasal congestion: Secondary | ICD-10-CM

## 2022-02-28 DIAGNOSIS — J029 Acute pharyngitis, unspecified: Secondary | ICD-10-CM

## 2022-02-28 MED ORDER — BENZONATATE 100 MG PO CAPS: ORAL_CAPSULE | ORAL | 0 refills | Status: DC

## 2022-02-28 NOTE — Patient Instructions (Signed)
-  I sent the medication(s) we discussed to your pharmacy: Meds ordered this encounter  Medications   benzonatate (TESSALON PERLES) 100 MG capsule    Sig: 1-2 capsules up to twice daily as needed for cough.    Dispense:  30 capsule    Refill:  0   Warm salt water gargles twice daily for 1 week  Take your zyrtec daily  Flonase 2 sprays each nostril daily for 2 weeks  No dairy products  Drink plenty of water and avoid sweets.  Can use tylenol for the sore throat too if needed.   I hope you are feeling better soon!  Seek in person care promptly if your symptoms worsen, new concerns arise or you are not improving with treatment.  It was nice to meet you today. I help [facility] out with telemedicine visits on Tuesdays and Thursdays and am happy to help if you need a virtual follow up visit on those days. Otherwise, if you have any concerns or questions following this visit please schedule a follow up visit with your Primary Care office or seek care at a local urgent care clinic to avoid delays in care. If you are having severe or life threatening symptoms please call 911 and/or go to the nearest emergency room.

## 2022-02-28 NOTE — Progress Notes (Signed)
Virtual Visit via Video Note  I connected with Debra Wagner  on 02/28/22 at  3:00 PM EDT by a video enabled telemedicine application and verified that I am speaking with the correct person using two identifiers.  Location patient: [place] Location provider:work or home office Persons participating in the virtual visit: patient, provider  I discussed the limitations and requested verbal permission for telemedicine visit. The patient expressed understanding and agreed to proceed.   HPI:  Acute telemedicine visit for : -Onset: 1-2 days -Symptoms include:sore throat, body aches, cough, mild sinus congestion - she thought could be allergies so tried a zyrtec, a little sneezing, itchy eyes a little -niece was sick recently too and she was around her -covid test at home was negative -Denies:fevers, CP, SOB, vomiting, diarrhea -drinking fluids -Has tried: tylenol -Pertinent past medical history: see below -Pertinent medication allergies: Allergies  Allergen Reactions   Sulfa Antibiotics     Other reaction(s): Other (See Comments) Other Reaction: OTHER REACTION   -COVID-19 vaccine status:  Immunization History  Administered Date(s) Administered   Influenza,inj,Quad PF,6+ Mos 10/13/2013, 09/07/2014, 06/25/2015, 12/10/2017, 07/05/2019   PFIZER(Purple Top)SARS-COV-2 Vaccination 11/28/2019, 12/23/2019, 09/15/2020   PPD Test 01/19/2015   Tdap 10/13/2013     ROS: See pertinent positives and negatives per HPI.  Past Medical History:  Diagnosis Date   GAD (generalized anxiety disorder) 01/19/2015   Sees psychiatrist    Heart murmur    High cholesterol    Migraines 01/19/2015   Palpitations 01/19/2015   Sees cardiologist    Seasonal allergies 01/19/2015    History reviewed. No pertinent surgical history.   Current Outpatient Medications:    ALPRAZolam (XANAX) 0.25 MG tablet, Take 1 tablet (0.25 mg total) by mouth daily as needed for anxiety., Disp: 30 tablet, Rfl: 0   amLODipine (NORVASC) 5  MG tablet, TAKE 1 TABLET(5 MG) BY MOUTH DAILY, Disp: 90 tablet, Rfl: 0   benzonatate (TESSALON PERLES) 100 MG capsule, 1-2 capsules up to twice daily as needed for cough., Disp: 30 capsule, Rfl: 0   cetirizine (ZYRTEC) 10 MG tablet, Take 1 tablet (10 mg total) by mouth daily., Disp: 30 tablet, Rfl: 5   fluticasone (FLONASE) 50 MCG/ACT nasal spray, Place 2 sprays into both nostrils daily., Disp: 16 g, Rfl: 5   Multiple Vitamin (MULTIVITAMIN) capsule, Take 1 capsule by mouth daily., Disp: , Rfl:    sertraline (ZOLOFT) 50 MG tablet, TAKE 1 TABLET(50 MG) BY MOUTH DAILY, Disp: 90 tablet, Rfl: 1  EXAM:  VITALS per patient if applicable: T [id number], BP [id number]  GENERAL: alert, oriented, appears well and in no acute distress  HEENT: atraumatic, conjunttiva clear, no obvious abnormalities on inspection of external nose and ears, moist mucus membranes, no appreciable tonsillar exudate or edema on video visit exam of oropharynx  NECK: normal movements of the head and neck  LUNGS: on inspection no signs of respiratory distress, breathing rate appears normal, no obvious gross SOB, gasping or wheezing  CV: no obvious cyanosis  MS: moves all visible extremities without noticeable abnormality  PSYCH/NEURO: pleasant and cooperative, no obvious depression or anxiety, speech and thought processing grossly intact  ASSESSMENT AND PLAN:  Discussed the following assessment and plan:  Cough, unspecified type  Sore throat  Nasal congestion  -we discussed possible serious and likely etiologies, options for evaluation and workup, limitations of telemedicine visit vs in person visit, treatment, treatment risks and precautions. Pt is agreeable to treatment via telemedicine at this moment. Suspect allergic vs viral symptoms vs  other. She reports a home covid test was negative. Advised retesting in 2 days. Offered strep testing, but she did not wish to come to office - doubt this dx with other symptoms. Opted to  treat with antihistamine, flonase, salt wart gargles, Tessalon rx.  Advised to seek prompt virtual visit or in person care if worsening, new symptoms arise, or if is not improving with treatment as expected per our conversation of expected course. Discussed options for follow up care. Did let this patient know that I do telemedicine on Tuesdays and Thursdays for [facility] and those are the days I am logged into the system. Advised to schedule follow up visit with PCP, [place] virtual visits or [facility] if any further questions or concerns to avoid delays in care.   I discussed the assessment and treatment plan with the patient. The patient was provided an opportunity to ask questions and all were answered. The patient agreed with the plan and demonstrated an understanding of the instructions.     Debra Kern, DO

## 2022-05-27 ENCOUNTER — Other Ambulatory Visit: Payer: Self-pay | Admitting: Pharmacist

## 2022-05-27 NOTE — Progress Notes (Signed)
   Chief Complaint  Patient presents with   Blood Pressure Check    Patient reports affordability concerns with their medications: No  Patient reports access/transportation concerns to their pharmacy: No  Patient reports adherence concerns with their medications:  No    Patient reports she takes amlodipine every evening. Reports home blood pressure readings with SBP <130 at home. Reports she recently saw Dr. Sharyn Lull (cardiology) for BP follow up, received refills on amlodipine.   Counseled on appropriate cuff sizes.   Recommended to continue current regimen at this time.   Catie Eppie Gibson, PharmD, [facility] [phone]

## 2022-06-14 ENCOUNTER — Encounter: Payer: Self-pay | Admitting: Family Medicine

## 2022-06-14 ENCOUNTER — Ambulatory Visit (INDEPENDENT_AMBULATORY_CARE_PROVIDER_SITE_OTHER): Payer: Commercial Managed Care - HMO | Admitting: Family Medicine

## 2022-06-14 VITALS — BP 108/82 | HR 65 | Temp 98.4°F | Ht 62.5 in | Wt 217.9 lb

## 2022-06-14 DIAGNOSIS — J069 Acute upper respiratory infection, unspecified: Secondary | ICD-10-CM | POA: Diagnosis not present

## 2022-06-14 DIAGNOSIS — J029 Acute pharyngitis, unspecified: Secondary | ICD-10-CM

## 2022-06-14 LAB — POCT INFLUENZA A/B
Influenza A, POC: NEGATIVE
Influenza B, POC: NEGATIVE

## 2022-06-14 LAB — POC COVID19 BINAXNOW: SARS Coronavirus 2 Ag: NEGATIVE

## 2022-06-14 NOTE — Progress Notes (Signed)
Established Patient Office Visit  Subjective   Patient ID: Debra Wagner, female    DOB: 1970-12-12  Age: 51 y.o. MRN: [id number]  Chief Complaint  Patient presents with   Sore Throat    X3 days   Headache    X4 days, tried Tylenol, Alka Seltzer Cold and Flu with some relief   Generalized Body Aches    X2 days ago    HPI   Patient seen with upper respiratory symptoms past 3 days or so.  She has had some sore throat, intermittent headache, body aches, nasal congestion, and occasional earache.  No fever.  She has a niece that has been ill recently and attends daycare.  Denies any nausea, vomiting, or diarrhea.  No dyspnea.  Past Medical History:  Diagnosis Date   GAD (generalized anxiety disorder) 01/19/2015   Sees psychiatrist    Heart murmur    High cholesterol    Migraines 01/19/2015   Palpitations 01/19/2015   Sees cardiologist    Seasonal allergies 01/19/2015   History reviewed. No pertinent surgical history.  reports that she has never smoked. She has never used smokeless tobacco. She reports that she does not drink alcohol and does not use drugs. family history includes Alzheimer's disease in her maternal aunt and maternal grandmother; CAD in her mother; Diabetes in her sister and another family member; Healthy in her brother, brother, and sister; Heart attack (age of onset: 68) in her sister; Heart attack (age of onset: 79) in her father; Heart disease in her father, paternal grandmother, sister, and another family member; Heart failure in her sister; High Cholesterol in her father and mother; High blood pressure in her brother; Hypertension in an other family member; Obesity in her sister; Stroke in an other family member; Stroke (age of onset: 75) in her mother; Sudden death in an other family member. Allergies  Allergen Reactions   Sulfa Antibiotics     Other reaction(s): Other (See Comments) Other Reaction: OTHER REACTION    Review of Systems  Constitutional:   Positive for malaise/fatigue.  HENT:  Positive for congestion, ear pain and sore throat.   Respiratory:  Positive for cough.       Objective:     BP 108/82 (BP Location: Left Arm, Patient Position: Sitting, Cuff Size: Large)   Pulse 65   Temp 98.4 F (36.9 C) (Oral)   Ht 5' 2.5" (1.588 m)   Wt 217 lb 14.4 oz (98.8 kg)   SpO2 99%   BMI 39.22 kg/m    Physical Exam Vitals reviewed.  Constitutional:      General: She is not in acute distress.    Appearance: She is not ill-appearing.  HENT:     Right Ear: Tympanic membrane normal.     Left Ear: Tympanic membrane normal.     Mouth/Throat:     Comments: Oropharynx is moist.  No erythema.  No exudate. Cardiovascular:     Rate and Rhythm: Normal rate and regular rhythm.     Heart sounds: No murmur heard. Pulmonary:     Effort: Pulmonary effort is normal.     Breath sounds: Normal breath sounds.  Musculoskeletal:     Cervical back: Neck supple.  Lymphadenopathy:     Cervical: No cervical adenopathy.  Neurological:     Mental Status: She is alert.      Results for orders placed or performed in visit on 06/14/22  POC COVID-19  Result Value Ref Range   SARS Coronavirus  2 Ag Negative Negative  POC Influenza A/B  Result Value Ref Range   Influenza A, POC Negative Negative   Influenza B, POC Negative Negative      The 10-year ASCVD risk score (Arnett DK, et al., 2019) is: 1.8%    Assessment & Plan:   Problem List Items Addressed This Visit   None Visit Diagnoses     Sore throat    -  Primary   Relevant Orders   POC COVID-19 (Completed)   POC Influenza A/B (Completed)   Viral upper respiratory illness         Influenza testing and COVID testing negative.  Patient not toxic in appearance with nonfocal exam.  -She has some Tessalon Perles leftover and will take that for cough -Plenty fluids and rest -Follow-up promptly for any fever or any persistent or worsening symptoms  No follow-ups on file.     Evelena Peat, MD

## 2022-06-14 NOTE — Progress Notes (Signed)
Viral

## 2022-06-26 ENCOUNTER — Encounter: Payer: Commercial Managed Care - HMO | Admitting: Family Medicine

## 2022-07-17 ENCOUNTER — Other Ambulatory Visit: Payer: Self-pay | Admitting: Obstetrics & Gynecology

## 2022-07-17 ENCOUNTER — Other Ambulatory Visit: Payer: Self-pay | Admitting: Obstetrics and Gynecology

## 2022-07-17 DIAGNOSIS — R928 Other abnormal and inconclusive findings on diagnostic imaging of breast: Secondary | ICD-10-CM

## 2022-07-18 ENCOUNTER — Other Ambulatory Visit: Payer: Self-pay | Admitting: Obstetrics and Gynecology

## 2022-07-18 ENCOUNTER — Ambulatory Visit (INDEPENDENT_AMBULATORY_CARE_PROVIDER_SITE_OTHER): Payer: Commercial Managed Care - HMO | Admitting: Family Medicine

## 2022-07-18 ENCOUNTER — Encounter: Payer: Self-pay | Admitting: Family Medicine

## 2022-07-18 VITALS — BP 108/84 | HR 75 | Temp 98.5°F | Ht 62.5 in | Wt 214.9 lb

## 2022-07-18 DIAGNOSIS — N6489 Other specified disorders of breast: Secondary | ICD-10-CM

## 2022-07-18 DIAGNOSIS — F411 Generalized anxiety disorder: Secondary | ICD-10-CM

## 2022-07-18 DIAGNOSIS — K219 Gastro-esophageal reflux disease without esophagitis: Secondary | ICD-10-CM | POA: Diagnosis not present

## 2022-07-18 DIAGNOSIS — Z1211 Encounter for screening for malignant neoplasm of colon: Secondary | ICD-10-CM

## 2022-07-18 DIAGNOSIS — E559 Vitamin D deficiency, unspecified: Secondary | ICD-10-CM

## 2022-07-18 DIAGNOSIS — E669 Obesity, unspecified: Secondary | ICD-10-CM

## 2022-07-18 LAB — TSH: TSH: 2.59 u[IU]/mL (ref 0.35–5.50)

## 2022-07-18 LAB — COMPREHENSIVE METABOLIC PANEL
ALT: 14 U/L (ref 0–35)
AST: 17 U/L (ref 0–37)
Albumin: 4.3 g/dL (ref 3.5–5.2)
Alkaline Phosphatase: 95 U/L (ref 39–117)
BUN: 12 mg/dL (ref 6–23)
CO2: 28 mEq/L (ref 19–32)
Calcium: 9.4 mg/dL (ref 8.4–10.5)
Chloride: 105 mEq/L (ref 96–112)
Creatinine, Ser: 0.73 mg/dL (ref 0.40–1.20)
GFR: 96.49 mL/min (ref >60.00)
Glucose, Bld: 95 mg/dL (ref 70–99)
Potassium: 3.8 mEq/L (ref 3.5–5.1)
Sodium: 139 mEq/L (ref 135–145)
Total Bilirubin: 0.5 mg/dL (ref 0.2–1.2)
Total Protein: 7.9 g/dL (ref 6.0–8.3)

## 2022-07-18 LAB — VITAMIN B12: Vitamin B-12: 370 pg/mL (ref 211–911)

## 2022-07-18 LAB — LIPID PANEL
Cholesterol: 188 mg/dL (ref 0–200)
HDL: 49.4 mg/dL (ref >39.00)
LDL Cholesterol: 128 mg/dL — ABNORMAL HIGH (ref 0–99)
NonHDL: 138.17
Total CHOL/HDL Ratio: 4
Triglycerides: 49 mg/dL (ref 0.0–149.0)
VLDL: 9.8 mg/dL (ref 0.0–40.0)

## 2022-07-18 LAB — VITAMIN D 25 HYDROXY (VIT D DEFICIENCY, FRACTURES): VITD: 32.34 ng/mL (ref 30.00–100.00)

## 2022-07-18 LAB — HEMOGLOBIN A1C: Hgb A1c MFr Bld: 6.2 % (ref 4.6–6.5)

## 2022-07-18 MED ORDER — OMEPRAZOLE 20 MG PO CPDR: 20.0000 mg | DELAYED_RELEASE_CAPSULE | Freq: Every day | ORAL | 5 refills | Status: DC

## 2022-07-18 MED ORDER — TOPIRAMATE 25 MG PO TABS: 25.0000 mg | ORAL_TABLET | Freq: Every day | ORAL | 2 refills | Status: DC

## 2022-07-18 MED ORDER — PHENTERMINE HCL 15 MG PO CAPS: 15.0000 mg | ORAL_CAPSULE | ORAL | 2 refills | Status: DC

## 2022-07-18 NOTE — Assessment & Plan Note (Signed)
Followed by psych, I advised she continue her current medications, ordering B12 and TSH today

## 2022-07-18 NOTE — Assessment & Plan Note (Signed)
Pt states she was on this previously, had good control of her acid reflux symptoms with this medication. would like a new rx of prilosec 20 mg daily, rx sent.

## 2022-07-18 NOTE — Assessment & Plan Note (Signed)
I have had an extensive 30 minute conversation today with the patient about healthy eating habits, exercise, calorie and carb goals for sustainable and successful weight loss. I gave the patient caloric and protein daily intake values as well as described the importance of increasing fiber and water intake. I discussed weight loss medications that could be used in the treatment of this patient. Decision was made to start on phentermine and topiramate. I will see her back in 3 months for a weight check. Pt is to call me if she has any side effects to the medications.

## 2022-07-18 NOTE — Progress Notes (Signed)
Established Patient Office Visit  Subjective   Patient ID: Debra Wagner, female    DOB: [birth year]  Age: 51 y.o. MRN: [id number]  Chief Complaint  Patient presents with   Establish Care    Patient is here for transition of care visit. Patient reports that she is trying to lose weight. States that she is counting calories, using myfitnesspal on her phone to track her intake. She is also increasing her exercise, 4-5 days per week, has been doing this for a few months. States she has lost a modest amount of weight, however she has not yet reached her goal. We discussed medications at length today in the visit.   HTN -- BP in office performed and is well controlled. She  reports no side effects to the medications, no chest pain, SOB, dizziness or headaches. She has a BP cuff at home and is checking BP regularly, reports they are in the normal range.   Anxiety-- on sertraline 50 mg daily and PRN xanax (rx'd by a different provider). PMDP reviewed. She reports relatively good control of her anxiety at this time. No side effects from the meds are reported.      Current Outpatient Medications  Medication Instructions   ALPRAZolam (XANAX) 0.25 mg, Oral, Daily PRN   amLODipine (NORVASC) 5 MG tablet TAKE 1 TABLET(5 MG) BY MOUTH DAILY   cetirizine (ZYRTEC) 10 mg, Oral, Daily   fluticasone (FLONASE) 50 MCG/ACT nasal spray 2 sprays, Each Nare, Daily   Multiple Vitamin (MULTIVITAMIN) capsule 1 capsule, Oral, Daily   omeprazole (PRILOSEC) 20 mg, Oral, Daily   phentermine 15 mg, Oral, BH-each morning   sertraline (ZOLOFT) 50 MG tablet TAKE 1 TABLET(50 MG) BY MOUTH DAILY   topiramate (TOPAMAX) 25 mg, Oral, Daily at bedtime    Patient Active Problem List   Diagnosis Date Noted   GERD without esophagitis 07/18/2022   Obesity (BMI 30-39.9) 07/18/2022   Impaired fasting glucose 10/13/2021   Hypertension 06/22/2021   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     Objective:     BP 108/84 (BP Location: Left Arm, Patient Position: Sitting, Cuff Size: Large)   Pulse 75   Temp 98.5 F (36.9 C) (Oral)   Ht 5' 2.5" (1.588 m)   Wt 214 lb 14.4 oz (97.5 kg)   SpO2 98%   BMI 38.68 kg/m    Physical Exam Vitals reviewed.  Constitutional:      Appearance: Normal appearance. She is well-groomed. She is obese.  Eyes:     Conjunctiva/sclera: Conjunctivae normal.  Neck:     Thyroid: No thyromegaly.  Cardiovascular:     Rate and Rhythm: Normal rate and regular rhythm.     Heart sounds: S1 normal and S2 normal.  Pulmonary:     Effort: Pulmonary effort is normal.     Breath sounds: Normal breath sounds and air entry.  Abdominal:     General: Bowel sounds are normal.  Musculoskeletal:        General: Normal range of motion.     Right lower leg: No edema.     Left lower leg: No edema.  Neurological:     Mental Status: She is alert and oriented to person, place, and time. Mental status is at baseline.     Gait: Gait is  intact.  Psychiatric:        Mood and Affect: Mood and affect normal.        Speech: Speech normal.        Behavior: Behavior normal.        Judgment: Judgment normal.      No results found for any visits on 07/18/22.    The 10-year ASCVD risk score (Arnett DK, et al., 2019) is: 1.8%    Assessment & Plan:   Problem List Items Addressed This Visit       Digestive   GERD without esophagitis    Pt states she was on this previously, had good control of her acid reflux symptoms with this medication. would like a new rx of prilosec 20 mg daily, rx sent.      Relevant Medications   omeprazole (PRILOSEC) 20 MG capsule     Other   GAD (generalized anxiety disorder)    Followed by psych, I advised she continue her current medications, ordering B12 and TSH today       Relevant Orders   TSH   Vitamin B12   Obesity (BMI 30-39.9)    I have had an extensive 30 minute conversation today with the patient about healthy eating habits, exercise, calorie and carb goals for sustainable and successful weight loss. I gave the patient caloric and protein daily intake values as well as described the importance of increasing fiber and water intake. I discussed weight loss medications that could be used in the treatment of this patient. Decision was made to start on phentermine and topiramate. I will see her back in 3 months for a weight check. Pt is to call me if she has any side effects to the medications.      Relevant Medications   phentermine 15 MG capsule   topiramate (TOPAMAX) 25 MG tablet   Other Relevant Orders   Lipid Panel   CMP   Hemoglobin A1c   Other Visit Diagnoses     Colon cancer screening    -  Primary   Relevant Orders   Cologuard   Vitamin D deficiency       Relevant Orders   Vitamin D, 25-hydroxy -- history of, needs new level today.       Return in about 3 months (around 10/18/2022) for weight check and recheck BP.    Karie Georges, MD

## 2022-07-20 NOTE — Progress Notes (Signed)
Labs are stable from last year, her A1C is slightly higher at 6.2 -- I advised that she cut down on sugar and starches in her diet, increase protein and green leafy veggies. No medications are recommended at this time.

## 2022-07-27 ENCOUNTER — Other Ambulatory Visit: Payer: Self-pay | Admitting: *Deleted

## 2022-07-27 MED ORDER — SERTRALINE HCL 50 MG PO TABS: ORAL_TABLET | ORAL | 1 refills | Status: DC

## 2022-08-08 ENCOUNTER — Ambulatory Visit
Admission: RE | Admit: 2022-08-08 | Discharge: 2022-08-08 | Disposition: A | Discharge status: Home / Self Care | Payer: Commercial Managed Care - HMO | Source: Ambulatory Visit | Attending: Obstetrics and Gynecology | Admitting: Obstetrics and Gynecology

## 2022-08-08 DIAGNOSIS — N6489 Other specified disorders of breast: Secondary | ICD-10-CM

## 2022-08-23 LAB — COLOGUARD: COLOGUARD: NEGATIVE

## 2022-08-23 NOTE — Progress Notes (Signed)
Negative cologuard

## 2022-10-13 ENCOUNTER — Other Ambulatory Visit: Payer: Self-pay | Admitting: Family Medicine

## 2022-10-13 DIAGNOSIS — E669 Obesity, unspecified: Secondary | ICD-10-CM

## 2022-10-18 ENCOUNTER — Ambulatory Visit: Payer: Commercial Managed Care - HMO | Admitting: Family Medicine

## 2022-10-27 ENCOUNTER — Ambulatory Visit: Payer: Commercial Managed Care - HMO | Admitting: Family Medicine

## 2022-11-06 ENCOUNTER — Ambulatory Visit: Payer: 59 | Admitting: Family Medicine

## 2022-11-06 VITALS — BP 146/82 | HR 70 | Temp 98.3°F | Ht 62.5 in | Wt 213.5 lb

## 2022-11-06 DIAGNOSIS — M26609 Unspecified temporomandibular joint disorder, unspecified side: Secondary | ICD-10-CM | POA: Diagnosis not present

## 2022-11-06 DIAGNOSIS — I10 Essential (primary) hypertension: Secondary | ICD-10-CM

## 2022-11-06 DIAGNOSIS — G43809 Other migraine, not intractable, without status migrainosus: Secondary | ICD-10-CM

## 2022-11-06 MED ORDER — NAPROXEN 500 MG PO TABS: 500.0000 mg | ORAL_TABLET | Freq: Two times a day (BID) | ORAL | 0 refills | Status: DC

## 2022-11-06 MED ORDER — AMLODIPINE BESYLATE 5 MG PO TABS: 5.0000 mg | ORAL_TABLET | Freq: Every day | ORAL | 1 refills | Status: DC

## 2022-11-06 MED ORDER — NORTRIPTYLINE HCL 10 MG PO CAPS: 10.0000 mg | ORAL_CAPSULE | Freq: Every day | ORAL | 1 refills | Status: DC

## 2022-11-06 MED ORDER — LOSARTAN POTASSIUM 25 MG PO TABS: 25.0000 mg | ORAL_TABLET | Freq: Every day | ORAL | 1 refills | Status: DC

## 2022-11-06 NOTE — Patient Instructions (Signed)
Message me in [place] in about 1 month to let me know if the medication is working for you.

## 2022-11-06 NOTE — Progress Notes (Unsigned)
Established Patient Office Visit  Subjective   Patient ID: Debra Wagner, female    DOB: 1971/01/31  Age: 52 y.o. MRN: [id number]  Chief Complaint  Patient presents with   Medical Management of Chronic Issues    Patient reports that she did not do well with the phentermine and the topiramate. States that she is doing just diet and exercise and has lost some weight.   Pt reports she is having ear pain recently that is causing migraines. States she is having pain on both sides right before her ears, like where her TMJ is. States that it happens a lot in the mornings when she wakes up, states she has tried zyrtec and tylenol which does help sometimes.    Current Outpatient Medications  Medication Instructions   ALPRAZolam (XANAX) 0.25 mg, Oral, Daily PRN   amLODipine (NORVASC) 5 mg, Oral, Daily   cetirizine (ZYRTEC) 10 mg, Oral, Daily   fluticasone (FLONASE) 50 MCG/ACT nasal spray 2 sprays, Each Nare, Daily   losartan (COZAAR) 25 mg, Oral, Daily   Multiple Vitamin (MULTIVITAMIN) capsule 1 capsule, Oral, Daily   naproxen (NAPROSYN) 500 mg, Oral, 2 times daily with meals, Then use as needed after this 14 day period   nortriptyline (PAMELOR) 10 mg, Oral, Daily at bedtime   omeprazole (PRILOSEC) 20 mg, Oral, Daily   sertraline (ZOLOFT) 50 MG tablet TAKE 1 TABLET(50 MG) BY MOUTH DAILY    Patient Active Problem List   Diagnosis Date Noted   GERD without esophagitis 07/18/2022   Obesity (BMI 30-39.9) 07/18/2022   Impaired fasting glucose 10/13/2021   Hypertension 06/22/2021   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     Objective:     BP (!) 146/82 Comment: repeated by Mykal--jaf  Pulse 70   Temp 98.3 F (36.8 C) (Oral)   Ht 5' 2.5" (1.588 m)   Wt 213 lb 8 oz (96.8 kg)   SpO2 98%   BMI  38.43 kg/m  BP Readings from Last 3 Encounters:  11/06/22 (!) 146/82  07/18/22 108/84  06/14/22 108/82      Physical Exam Vitals reviewed.  Constitutional:      Appearance: Normal appearance. She is well-groomed. She is obese.  Eyes:     Conjunctiva/sclera: Conjunctivae normal.  Neck:     Thyroid: No thyromegaly.  Cardiovascular:     Rate and Rhythm: Normal rate and regular rhythm.     Pulses: Normal pulses.     Heart sounds: S1 normal and S2 normal.  Pulmonary:     Effort: Pulmonary effort is normal.     Breath sounds: Normal breath sounds and air entry.  Abdominal:     General: Bowel sounds are normal.  Musculoskeletal:     Right lower leg: No edema.     Left lower leg: No edema.  Neurological:     Mental Status: She is alert and oriented to person, place, and time. Mental status is at baseline.     Gait: Gait is intact.  Psychiatric:        Mood and Affect: Mood and affect normal.        Speech: Speech normal.        Behavior: Behavior normal.        Judgment: Judgment normal.      No  results found for any visits on 11/06/22.  {Labs (Optional):23779}  The 10-year ASCVD risk score (Arnett DK, et al., 2019) is: 6.1%    Assessment & Plan:   Problem List Items Addressed This Visit       Unprioritized   Migraines   Relevant Medications   naproxen (NAPROSYN) 500 MG tablet   nortriptyline (PAMELOR) 10 MG capsule   amLODipine (NORVASC) 5 MG tablet   losartan (COZAAR) 25 MG tablet   Hypertension - Primary   Relevant Medications   amLODipine (NORVASC) 5 MG tablet   losartan (COZAAR) 25 MG tablet   Other Visit Diagnoses     TMJ (temporomandibular joint disorder)       Relevant Medications   naproxen (NAPROSYN) 500 MG tablet       Return in about 17 weeks (around 03/05/2023) for HTN, weight loss.    Farrel Conners, MD

## 2022-11-07 DIAGNOSIS — M26609 Unspecified temporomandibular joint disorder, unspecified side: Secondary | ICD-10-CM | POA: Insufficient documentation

## 2022-11-07 NOTE — Assessment & Plan Note (Signed)
Migraines are being exacerbated by what is most likely TMJ disorder. I recommended that we start nortriptyline 10 mg at bedtime to see if this will help both conditions.

## 2022-11-07 NOTE — Assessment & Plan Note (Signed)
Most likely diagnosis, will treat with naproxen 500 mg BID for 14 days, then she may reduce to as needed only. I advised that she try a bite guard at night in case she is grinding her teeth.

## 2022-11-07 NOTE — Assessment & Plan Note (Signed)
Current hypertension medications:       Sig   amLODipine (NORVASC) 5 MG tablet Take 1 tablet (5 mg total) by mouth daily.   losartan (COZAAR) 25 MG tablet Take 1 tablet (25 mg total) by mouth daily.      BP is slightly elevated today, could be due to the pain she is in, she has had normal readings in the past, will continue these medications as prescribed and will recheck her BP at the next visit.

## 2022-11-21 ENCOUNTER — Ambulatory Visit: Payer: Self-pay | Admitting: Family Medicine

## 2022-12-01 DIAGNOSIS — Z01419 Encounter for gynecological examination (general) (routine) without abnormal findings: Secondary | ICD-10-CM | POA: Diagnosis not present

## 2022-12-01 DIAGNOSIS — R635 Abnormal weight gain: Secondary | ICD-10-CM | POA: Diagnosis not present

## 2022-12-01 DIAGNOSIS — N952 Postmenopausal atrophic vaginitis: Secondary | ICD-10-CM | POA: Diagnosis not present

## 2022-12-01 DIAGNOSIS — N951 Menopausal and female climacteric states: Secondary | ICD-10-CM | POA: Diagnosis not present

## 2022-12-05 ENCOUNTER — Other Ambulatory Visit: Payer: Self-pay | Admitting: Family Medicine

## 2022-12-05 DIAGNOSIS — M26609 Unspecified temporomandibular joint disorder, unspecified side: Secondary | ICD-10-CM

## 2022-12-11 DIAGNOSIS — J029 Acute pharyngitis, unspecified: Secondary | ICD-10-CM | POA: Diagnosis not present

## 2022-12-11 DIAGNOSIS — I10 Essential (primary) hypertension: Secondary | ICD-10-CM | POA: Diagnosis not present

## 2023-02-06 ENCOUNTER — Other Ambulatory Visit: Payer: Self-pay | Admitting: *Deleted

## 2023-02-06 MED ORDER — SERTRALINE HCL 50 MG PO TABS: ORAL_TABLET | ORAL | 0 refills | Status: DC

## 2023-02-06 NOTE — Telephone Encounter (Signed)
Rx done. 

## 2023-03-05 ENCOUNTER — Ambulatory Visit: Payer: 59 | Admitting: Family Medicine

## 2023-03-05 ENCOUNTER — Encounter: Payer: Self-pay | Admitting: Family Medicine

## 2023-03-05 VITALS — BP 132/70 | HR 75 | Temp 98.4°F | Ht 62.5 in | Wt 220.2 lb

## 2023-03-05 DIAGNOSIS — I10 Essential (primary) hypertension: Secondary | ICD-10-CM

## 2023-03-05 DIAGNOSIS — E669 Obesity, unspecified: Secondary | ICD-10-CM | POA: Diagnosis not present

## 2023-03-05 DIAGNOSIS — Z6839 Body mass index (BMI) 39.0-39.9, adult: Secondary | ICD-10-CM | POA: Diagnosis not present

## 2023-03-05 MED ORDER — BUPROPION HCL ER (SR) 100 MG PO TB12: 100.0000 mg | ORAL_TABLET | Freq: Two times a day (BID) | ORAL | 2 refills | Status: DC

## 2023-03-05 MED ORDER — LOSARTAN POTASSIUM 50 MG PO TABS: 50.0000 mg | ORAL_TABLET | Freq: Every day | ORAL | 1 refills | Status: DC

## 2023-03-05 NOTE — Progress Notes (Signed)
   Established Patient Office Visit  Subjective   Patient ID: Debra Wagner, female    DOB: 1971-09-12  Age: 52 y.o. MRN: [id number]  Chief Complaint  Patient presents with  . Medical Management of Chronic Issues    Pt is here for follow up for her HTN. Last visit we had increased her amlodipine to 5 mg daily. She reports that she is also up to 50 mg on the losartan. States she is feeling better, tolerating the medications, no dizziness, headaches or chest pain  Patient reports that she is still struggling with weight loss, states that she is exercising daily, 10,000 steps per day and light weight training, she is also increasing protein and trying to watch her carbs-- she reports she tried the low carb in the past but it made her feel awful, no energy.   Current Outpatient Medications  Medication Instructions  . ALPRAZolam (XANAX) 0.25 mg, Oral, Daily PRN  . amLODipine (NORVASC) 5 mg, Oral, Daily  . cetirizine (ZYRTEC) 10 mg, Oral, Daily  . fluticasone (FLONASE) 50 MCG/ACT nasal spray 2 sprays, Each Nare, Daily  . losartan (COZAAR) 50 mg, Oral, Daily  . Multiple Vitamin (MULTIVITAMIN) capsule 1 capsule, Oral, Daily  . omeprazole (PRILOSEC) 20 mg, Oral, Daily  . sertraline (ZOLOFT) 50 MG tablet TAKE 1 TABLET(50 MG) BY MOUTH DAILY       Review of Systems  All other systems reviewed and are negative.     Objective:     BP 132/70 (BP Location: Left Arm, Patient Position: Sitting, Cuff Size: Large)   Pulse 75   Temp 98.4 F (36.9 C) (Oral)   Ht 5' 2.5" (1.588 m)   Wt 220 lb 3.2 oz (99.9 kg)   SpO2 99%   BMI 39.63 kg/m     Physical Exam Vitals reviewed.     No results found for any visits on 03/05/23.     The 10-year ASCVD risk score (Arnett DK, et al., 2019) is: 8.4%    Assessment & Plan:  Obesity (BMI 30-39.9)  Primary hypertension -     Losartan Potassium; Take 1 tablet (50 mg total) by mouth daily.  Dispense: 90 tablet; Refill: 1     No follow-ups on  file.    Karie Georges, MD

## 2023-03-07 NOTE — Assessment & Plan Note (Signed)
Current hypertension medications:       Sig   amLODipine (NORVASC) 5 MG tablet (Taking) Take 1 tablet (5 mg total) by mouth daily.   losartan (COZAAR) 50 MG tablet Take 1 tablet (50 mg total) by mouth daily.      BP is controlled on the above medications, will continue as prescribed, refilled the losartan today.

## 2023-03-07 NOTE — Assessment & Plan Note (Signed)
I have had an extensive 30 minute conversation today with the patient about healthy eating habits, exercise, calorie and carb goals for sustainable and successful weight loss. I gave the patient caloric and protein daily intake values as well as described the importance of increasing fiber and water intake. I discussed weight loss medications that could be used in the treatment of this patient. Handouts on low carb eating were given to the patient.    We discussed trying a medication to help with appetite control,  I recommended starting wellbutrin 100 mg daily to see if this would help reduce appetite.   Comorbid conditions include prediabetes and HTN.

## 2023-03-27 ENCOUNTER — Other Ambulatory Visit: Payer: Self-pay | Admitting: Family Medicine

## 2023-03-27 DIAGNOSIS — E669 Obesity, unspecified: Secondary | ICD-10-CM

## 2023-04-20 DIAGNOSIS — J029 Acute pharyngitis, unspecified: Secondary | ICD-10-CM | POA: Diagnosis not present

## 2023-04-20 DIAGNOSIS — J069 Acute upper respiratory infection, unspecified: Secondary | ICD-10-CM | POA: Diagnosis not present

## 2023-05-31 ENCOUNTER — Other Ambulatory Visit: Payer: Self-pay | Admitting: Family Medicine

## 2023-05-31 DIAGNOSIS — U071 COVID-19: Secondary | ICD-10-CM | POA: Diagnosis not present

## 2023-05-31 DIAGNOSIS — M26609 Unspecified temporomandibular joint disorder, unspecified side: Secondary | ICD-10-CM

## 2023-06-08 DIAGNOSIS — R0981 Nasal congestion: Secondary | ICD-10-CM | POA: Diagnosis not present

## 2023-06-08 DIAGNOSIS — Z6839 Body mass index (BMI) 39.0-39.9, adult: Secondary | ICD-10-CM | POA: Diagnosis not present

## 2023-06-08 DIAGNOSIS — I10 Essential (primary) hypertension: Secondary | ICD-10-CM | POA: Diagnosis not present

## 2023-06-08 DIAGNOSIS — U071 COVID-19: Secondary | ICD-10-CM | POA: Diagnosis not present

## 2023-06-14 DIAGNOSIS — J029 Acute pharyngitis, unspecified: Secondary | ICD-10-CM | POA: Diagnosis not present

## 2023-06-14 DIAGNOSIS — Z20822 Contact with and (suspected) exposure to covid-19: Secondary | ICD-10-CM | POA: Diagnosis not present

## 2023-06-15 DIAGNOSIS — H9202 Otalgia, left ear: Secondary | ICD-10-CM | POA: Diagnosis not present

## 2023-06-15 DIAGNOSIS — J310 Chronic rhinitis: Secondary | ICD-10-CM | POA: Diagnosis not present

## 2023-06-15 DIAGNOSIS — J343 Hypertrophy of nasal turbinates: Secondary | ICD-10-CM | POA: Diagnosis not present

## 2023-06-15 DIAGNOSIS — J342 Deviated nasal septum: Secondary | ICD-10-CM | POA: Diagnosis not present

## 2023-07-05 ENCOUNTER — Ambulatory Visit: Payer: 59 | Admitting: Family Medicine

## 2023-07-26 ENCOUNTER — Ambulatory Visit: Payer: 59 | Admitting: Family Medicine

## 2023-07-26 ENCOUNTER — Encounter: Payer: Self-pay | Admitting: Family Medicine

## 2023-07-26 VITALS — BP 110/80 | HR 72 | Temp 98.6°F | Ht 62.5 in | Wt 219.2 lb

## 2023-07-26 DIAGNOSIS — Z6839 Body mass index (BMI) 39.0-39.9, adult: Secondary | ICD-10-CM | POA: Diagnosis not present

## 2023-07-26 DIAGNOSIS — E669 Obesity, unspecified: Secondary | ICD-10-CM | POA: Diagnosis not present

## 2023-07-26 DIAGNOSIS — I10 Essential (primary) hypertension: Secondary | ICD-10-CM | POA: Diagnosis not present

## 2023-07-26 DIAGNOSIS — R7303 Prediabetes: Secondary | ICD-10-CM

## 2023-07-26 LAB — COMPREHENSIVE METABOLIC PANEL
ALT: 19 U/L (ref 0–35)
AST: 21 U/L (ref 0–37)
Albumin: 4.2 g/dL (ref 3.5–5.2)
Alkaline Phosphatase: 93 U/L (ref 39–117)
BUN: 14 mg/dL (ref 6–23)
CO2: 28 meq/L (ref 19–32)
Calcium: 9.4 mg/dL (ref 8.4–10.5)
Chloride: 105 meq/L (ref 96–112)
Creatinine, Ser: 0.73 mg/dL (ref 0.40–1.20)
GFR: 95.8 mL/min (ref >60.00)
Glucose, Bld: 89 mg/dL (ref 70–99)
Potassium: 3.7 meq/L (ref 3.5–5.1)
Sodium: 138 meq/L (ref 135–145)
Total Bilirubin: 0.5 mg/dL (ref 0.2–1.2)
Total Protein: 7.6 g/dL (ref 6.0–8.3)

## 2023-07-26 LAB — LIPID PANEL
Cholesterol: 198 mg/dL (ref 0–200)
HDL: 52.9 mg/dL (ref >39.00)
LDL Cholesterol: 134 mg/dL — ABNORMAL HIGH (ref 0–99)
NonHDL: 145.26
Total CHOL/HDL Ratio: 4
Triglycerides: 55 mg/dL (ref 0.0–149.0)
VLDL: 11 mg/dL (ref 0.0–40.0)

## 2023-07-26 LAB — CBC
HCT: 39.5 % (ref 36.0–46.0)
Hemoglobin: 12.4 g/dL (ref 12.0–15.0)
MCHC: 31.4 g/dL (ref 30.0–36.0)
MCV: 80.6 fL (ref 78.0–100.0)
Platelets: 260 10*3/uL (ref 150.0–400.0)
RBC: 4.9 Mil/uL (ref 3.87–5.11)
RDW: 15 % (ref 11.5–15.5)
WBC: 6.6 10*3/uL (ref 4.0–10.5)

## 2023-07-26 LAB — HEMOGLOBIN A1C: Hgb A1c MFr Bld: 6 % (ref 4.6–6.5)

## 2023-07-26 MED ORDER — TOPIRAMATE 50 MG PO TABS
ORAL_TABLET | ORAL | 2 refills | Status: DC
Start: 2023-07-26 — End: 2024-06-18

## 2023-07-26 MED ORDER — PHENTERMINE HCL 8 MG PO TABS: 8.0000 mg | ORAL_TABLET | Freq: Two times a day (BID) | ORAL | 0 refills | Status: DC

## 2023-07-26 MED ORDER — AMLODIPINE BESYLATE 5 MG PO TABS
5.0000 mg | ORAL_TABLET | Freq: Every day | ORAL | 1 refills | Status: DC
Start: 2023-07-26 — End: 2024-01-26

## 2023-07-26 NOTE — Progress Notes (Signed)
Established Patient Office Visit  Subjective   Patient ID: Debra Wagner, female    DOB: May 21, 1971  Age: 52 y.o. MRN: [id number]  Chief Complaint  Patient presents with   Medical Management of Chronic Issues    Patient is here for follow up today.  HTN -- BP in office performed and is well controlled. She  reports no side effects to the medications, no chest pain, SOB, dizziness or headaches. She has a BP cuff at home and is checking BP regularly, reports they are in the normal range.   MO-- states that she tried the wellbutrin 100 mg for appetite suppression however this was not successful for her, states it made her "feel weird" taking it with the sertraline. She continues on 1700 calorie restricted diet, gets >10000 steop each day, is tracking her protein and carbs on her app. We discussed lowering her carb goal and increasin her protein goal and she is agreeable. Counseled patient on phentermine and topiramate.     Current Outpatient Medications  Medication Instructions   ALPRAZolam (XANAX) 0.25 mg, Oral, Daily PRN   amLODipine (NORVASC) 5 mg, Oral, Daily   buPROPion ER (WELLBUTRIN SR) 100 mg, Oral, 2 times daily   cetirizine (ZYRTEC) 10 mg, Oral, Daily   fluticasone (FLONASE) 50 MCG/ACT nasal spray 2 sprays, Each Nare, Daily   losartan (COZAAR) 50 mg, Oral, Daily   Multiple Vitamin (MULTIVITAMIN) capsule 1 capsule, Oral, Daily   omeprazole (PRILOSEC) 20 mg, Oral, Daily   Phentermine HCl 8 mg, Oral, 2 times daily   sertraline (ZOLOFT) 50 MG tablet TAKE 1 TABLET(50 MG) BY MOUTH DAILY   topiramate (TOPAMAX) 50 MG tablet Take 0.5 tablets (25 mg total) by mouth at bedtime for 7 days, THEN 1 tablet (50 mg total) at bedtime for 7 days, THEN 1 tablet (50 mg total) 2 (two) times daily for 7 days.      Review of Systems  All other systems reviewed and are negative.     Objective:     BP 110/80 (BP Location: Left Arm, Patient Position: Sitting, Cuff Size: Large)   Pulse 72    Temp 98.6 F (37 C) (Oral)   Ht 5' 2.5" (1.588 m)   Wt 219 lb 3.2 oz (99.4 kg)   SpO2 100%   BMI 39.45 kg/m    Physical Exam Vitals reviewed.  Constitutional:      Appearance: Normal appearance. She is well-groomed. She is obese.  Cardiovascular:     Rate and Rhythm: Normal rate and regular rhythm.     Pulses: Normal pulses.     Heart sounds: S1 normal and S2 normal.  Pulmonary:     Effort: Pulmonary effort is normal.     Breath sounds: Normal breath sounds and air entry.  Musculoskeletal:     Right lower leg: No edema.     Left lower leg: No edema.  Neurological:     Mental Status: She is alert and oriented to person, place, and time. Mental status is at baseline.     Gait: Gait is intact.  Psychiatric:        Mood and Affect: Mood and affect normal.        Speech: Speech normal.        Behavior: Behavior normal.        Judgment: Judgment normal.      No results found for any visits on 07/26/23.    The 10-year ASCVD risk score (Arnett DK, et al., 2019)  is: 2%    Assessment & Plan:  Obesity (BMI 30-39.9) Assessment & Plan: Did not tolerate the wellbutrin, will switch to phentermine and topiramate combination. RTC in 3 months for weight check and follow up.   Orders: -     Phentermine HCl; Take 1 tablet (8 mg total) by mouth 2 (two) times daily.  Dispense: 60 tablet; Refill: 0 -     Topiramate; Take 0.5 tablets (25 mg total) by mouth at bedtime for 7 days, THEN 1 tablet (50 mg total) at bedtime for 7 days, THEN 1 tablet (50 mg total) 2 (two) times daily for 7 days.  Dispense: 60 tablet; Refill: 2  Primary hypertension Assessment & Plan: Current hypertension medications:       Sig   losartan (COZAAR) 50 MG tablet (Taking) Take 1 tablet (50 mg total) by mouth daily.   amLODipine (NORVASC) 5 MG tablet Take 1 tablet (5 mg total) by mouth daily.      BP is controlled on the above medications, will continue as prescribed, refilled the amlodipine today.    Orders: -     amLODIPine Besylate; Take 1 tablet (5 mg total) by mouth daily.  Dispense: 90 tablet; Refill: 1 -     Comprehensive metabolic panel -     CBC  Prediabetes Assessment & Plan: A1C last year was 6.2, needs new A1C this year  Orders: -     Lipid panel -     Hemoglobin A1c     Return in about 3 months (around 10/26/2023) for weight loss.    Karie Georges, MD

## 2023-07-26 NOTE — Assessment & Plan Note (Signed)
Current hypertension medications:       Sig   losartan (COZAAR) 50 MG tablet (Taking) Take 1 tablet (50 mg total) by mouth daily.   amLODipine (NORVASC) 5 MG tablet Take 1 tablet (5 mg total) by mouth daily.      BP is controlled on the above medications, will continue as prescribed, refilled the amlodipine today.

## 2023-07-26 NOTE — Assessment & Plan Note (Signed)
A1C last year was 6.2, needs new A1C this year

## 2023-07-26 NOTE — Assessment & Plan Note (Signed)
Did not tolerate the wellbutrin, will switch to phentermine and topiramate combination. RTC in 3 months for weight check and follow up.

## 2023-07-27 ENCOUNTER — Ambulatory Visit (INDEPENDENT_AMBULATORY_CARE_PROVIDER_SITE_OTHER): Payer: 59

## 2023-07-30 ENCOUNTER — Other Ambulatory Visit: Payer: Self-pay | Admitting: Obstetrics and Gynecology

## 2023-07-30 DIAGNOSIS — Z1231 Encounter for screening mammogram for malignant neoplasm of breast: Secondary | ICD-10-CM

## 2023-08-09 IMAGING — MG DIGITAL DIAGNOSTIC BILAT W/ TOMO W/ CAD
6 of 10 series · 6 of 30 positions shown · non-contrast
Comparison: Previous exam(s).

CLINICAL DATA: 50-year-old female presenting for annual exam as
well as short-term follow-up of questioned distortion in the left
breast which could not be reproduced at the time of stereotactic
biopsy in July 2020.

EXAM:
DIGITAL DIAGNOSTIC BILATERAL MAMMOGRAM WITH TOMOSYNTHESIS AND CAD
TECHNIQUE: Bilateral digital diagnostic mammography and breast tomosynthesis
was performed. The images were evaluated with computer-aided
detection.

[R CC synth-2D]
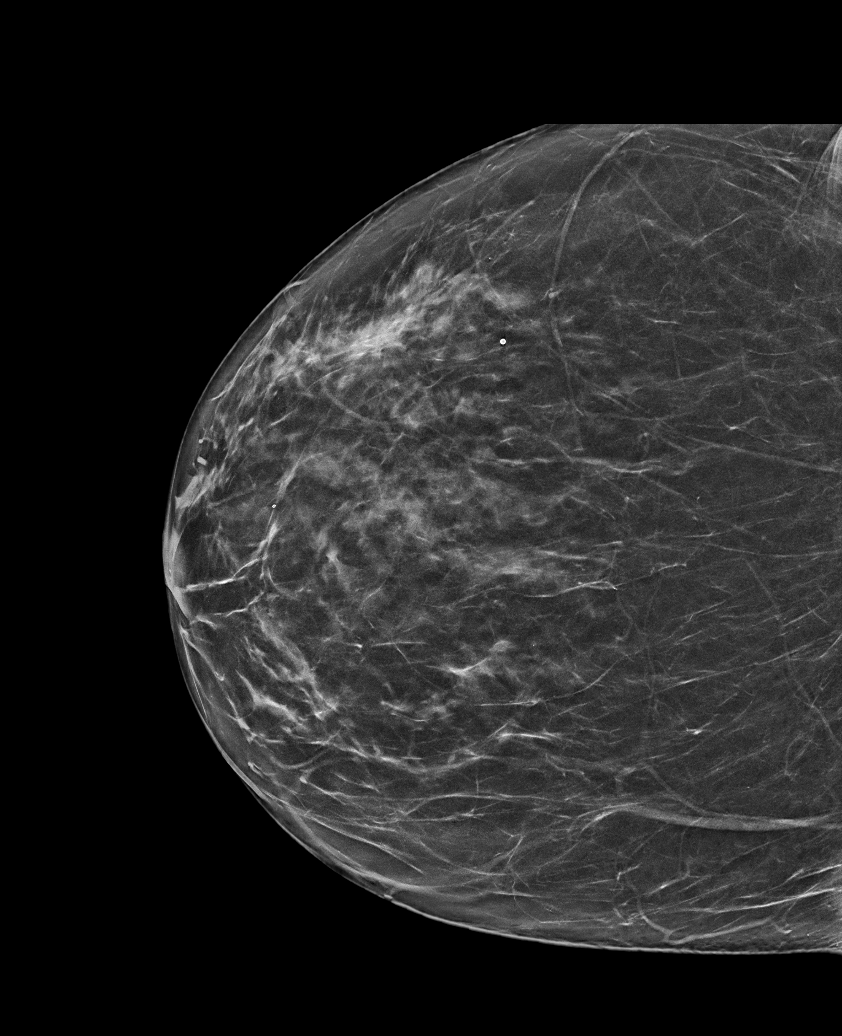

[R MLO synth-2D]
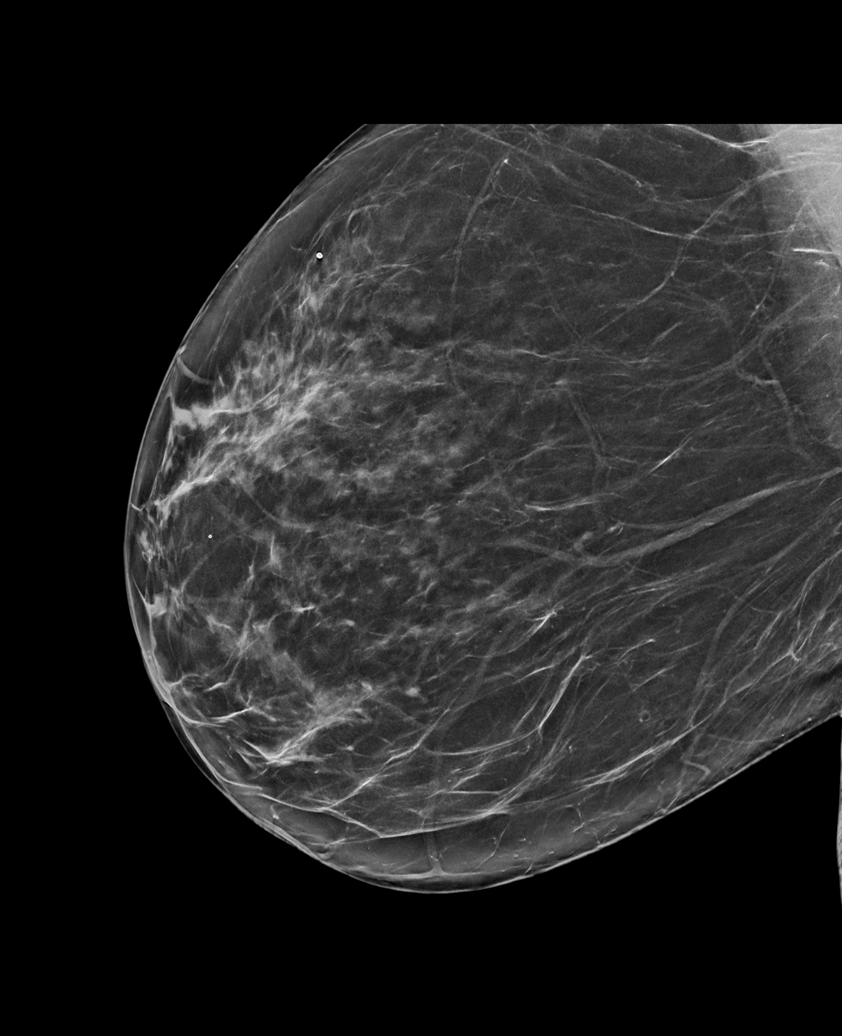

[R AT synth-2D]
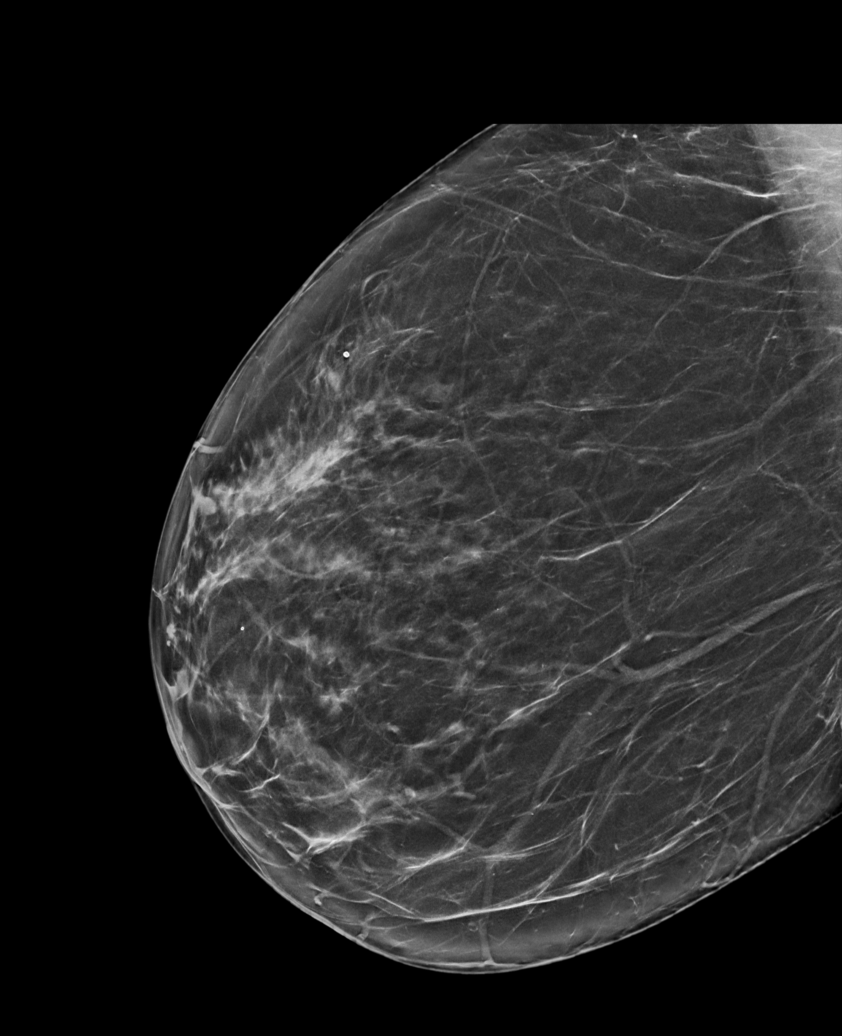

[L MLO synth-2D]
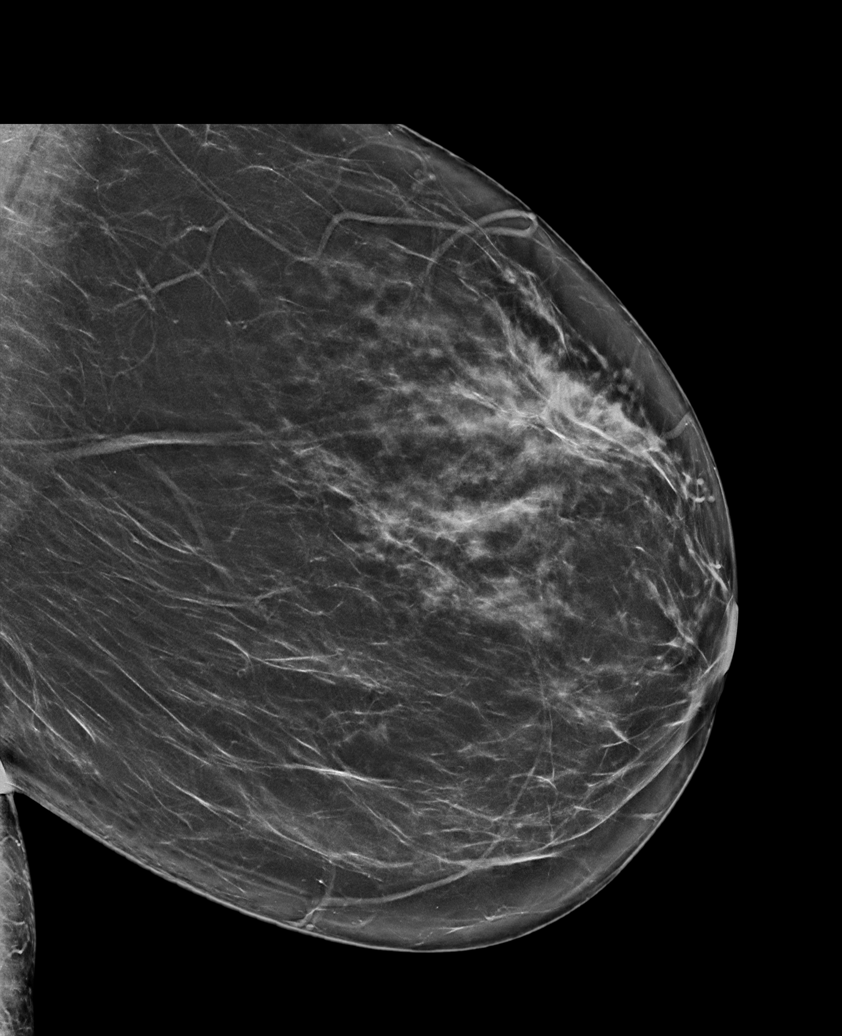

[L CC synth-2D]
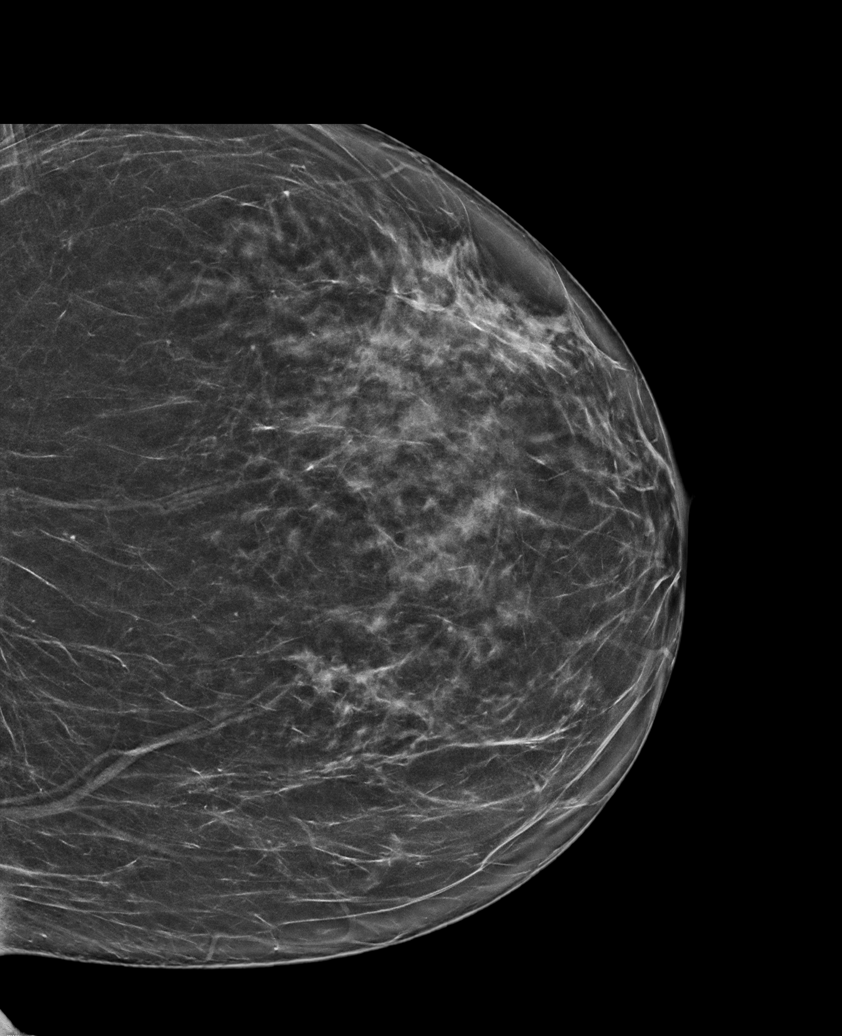

[L CC tomo · tomo slice 39/76.0]
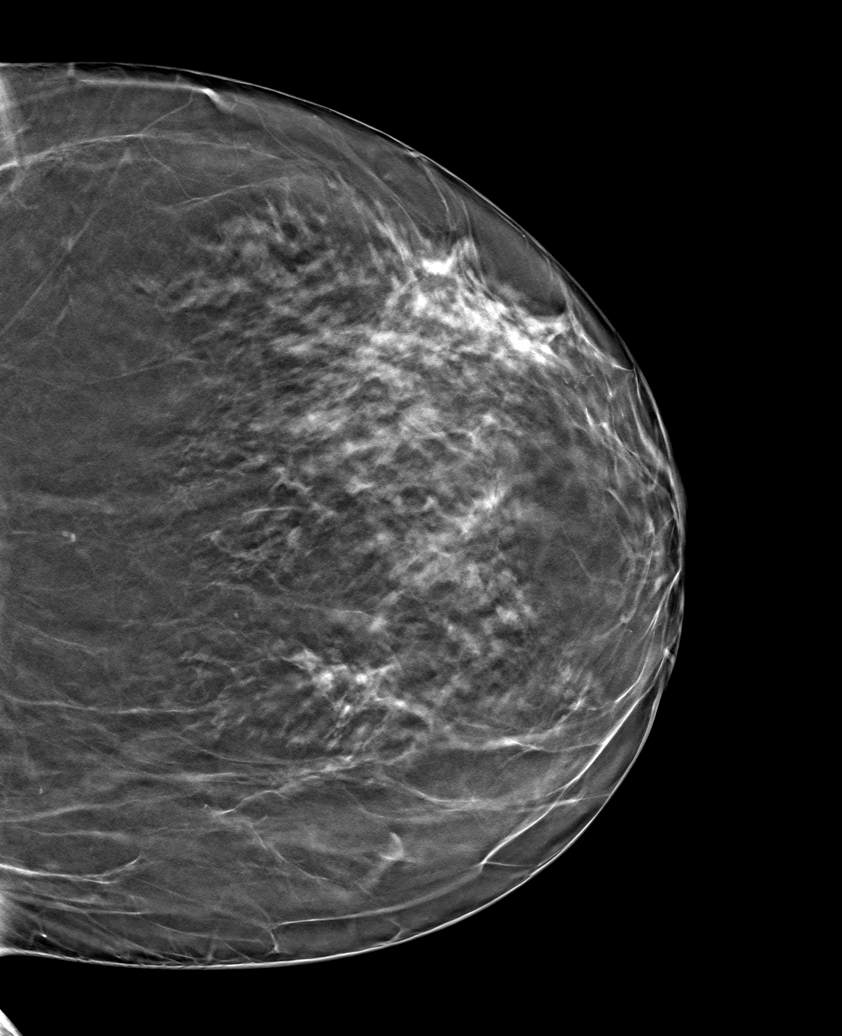

[6 of 30 positions shown; findings below may reference images not displayed]

ACR Breast Density Category b: There are scattered areas of
fibroglandular density.
FINDINGS: Right breast: No suspicious mass, distortion, or microcalcifications
are identified to suggest presence of malignancy.

Left breast: The question subtle area of distortion in the central
left breast is stable to slightly decreased in conspicuity compared
to prior. There are no new suspicious findings elsewhere in the left
breast.
IMPRESSION: 1.  Stable probably benign findings in the left breast.

2.  No mammographic evidence of malignancy in the right breast.

RECOMMENDATION:
Diagnostic bilateral mammogram in 1 year.

I have discussed the findings and recommendations with the patient.
If applicable, a reminder letter will be sent to the patient
regarding the next appointment.

BI-RADS CATEGORY  3: Probably benign.

## 2023-08-24 ENCOUNTER — Ambulatory Visit
Admission: RE | Admit: 2023-08-24 | Discharge: 2023-08-24 | Disposition: A | Discharge status: Home / Self Care | Payer: 59 | Source: Ambulatory Visit | Attending: Obstetrics and Gynecology | Admitting: Obstetrics and Gynecology

## 2023-08-24 DIAGNOSIS — Z1231 Encounter for screening mammogram for malignant neoplasm of breast: Secondary | ICD-10-CM | POA: Diagnosis not present

## 2023-08-25 ENCOUNTER — Other Ambulatory Visit: Payer: Self-pay | Admitting: Family Medicine

## 2023-08-25 DIAGNOSIS — I10 Essential (primary) hypertension: Secondary | ICD-10-CM

## 2023-09-05 DIAGNOSIS — J029 Acute pharyngitis, unspecified: Secondary | ICD-10-CM | POA: Diagnosis not present

## 2023-09-05 DIAGNOSIS — H66002 Acute suppurative otitis media without spontaneous rupture of ear drum, left ear: Secondary | ICD-10-CM | POA: Diagnosis not present

## 2023-09-05 DIAGNOSIS — Z20822 Contact with and (suspected) exposure to covid-19: Secondary | ICD-10-CM | POA: Diagnosis not present

## 2023-09-05 DIAGNOSIS — Z6838 Body mass index (BMI) 38.0-38.9, adult: Secondary | ICD-10-CM | POA: Diagnosis not present

## 2023-09-05 DIAGNOSIS — J069 Acute upper respiratory infection, unspecified: Secondary | ICD-10-CM | POA: Diagnosis not present

## 2023-10-14 ENCOUNTER — Other Ambulatory Visit: Payer: Self-pay | Admitting: Family Medicine

## 2023-10-14 DIAGNOSIS — K219 Gastro-esophageal reflux disease without esophagitis: Secondary | ICD-10-CM

## 2023-11-05 ENCOUNTER — Other Ambulatory Visit: Payer: Self-pay | Admitting: Family Medicine

## 2023-11-16 ENCOUNTER — Other Ambulatory Visit: Payer: Self-pay | Admitting: Family Medicine

## 2023-11-16 DIAGNOSIS — K219 Gastro-esophageal reflux disease without esophagitis: Secondary | ICD-10-CM

## 2024-01-04 DIAGNOSIS — D219 Benign neoplasm of connective and other soft tissue, unspecified: Secondary | ICD-10-CM | POA: Diagnosis not present

## 2024-01-04 DIAGNOSIS — R1032 Left lower quadrant pain: Secondary | ICD-10-CM | POA: Diagnosis not present

## 2024-01-04 DIAGNOSIS — Z01419 Encounter for gynecological examination (general) (routine) without abnormal findings: Secondary | ICD-10-CM | POA: Diagnosis not present

## 2024-01-14 DIAGNOSIS — R3989 Other symptoms and signs involving the genitourinary system: Secondary | ICD-10-CM | POA: Diagnosis not present

## 2024-01-14 DIAGNOSIS — R1032 Left lower quadrant pain: Secondary | ICD-10-CM | POA: Diagnosis not present

## 2024-01-14 DIAGNOSIS — D219 Benign neoplasm of connective and other soft tissue, unspecified: Secondary | ICD-10-CM | POA: Diagnosis not present

## 2024-01-26 ENCOUNTER — Other Ambulatory Visit: Payer: Self-pay | Admitting: Family Medicine

## 2024-01-26 DIAGNOSIS — I10 Essential (primary) hypertension: Secondary | ICD-10-CM

## 2024-02-19 DIAGNOSIS — I10 Essential (primary) hypertension: Secondary | ICD-10-CM | POA: Diagnosis not present

## 2024-02-19 DIAGNOSIS — Z6837 Body mass index (BMI) 37.0-37.9, adult: Secondary | ICD-10-CM | POA: Diagnosis not present

## 2024-02-19 DIAGNOSIS — Z823 Family history of stroke: Secondary | ICD-10-CM | POA: Diagnosis not present

## 2024-02-19 DIAGNOSIS — F419 Anxiety disorder, unspecified: Secondary | ICD-10-CM | POA: Diagnosis not present

## 2024-02-19 DIAGNOSIS — E785 Hyperlipidemia, unspecified: Secondary | ICD-10-CM | POA: Diagnosis not present

## 2024-02-19 DIAGNOSIS — Z833 Family history of diabetes mellitus: Secondary | ICD-10-CM | POA: Diagnosis not present

## 2024-02-19 DIAGNOSIS — Z882 Allergy status to sulfonamides status: Secondary | ICD-10-CM | POA: Diagnosis not present

## 2024-02-19 DIAGNOSIS — Z8249 Family history of ischemic heart disease and other diseases of the circulatory system: Secondary | ICD-10-CM | POA: Diagnosis not present

## 2024-02-27 ENCOUNTER — Other Ambulatory Visit: Payer: Self-pay | Admitting: Family Medicine

## 2024-02-27 DIAGNOSIS — I10 Essential (primary) hypertension: Secondary | ICD-10-CM

## 2024-04-15 ENCOUNTER — Other Ambulatory Visit: Payer: Self-pay | Admitting: Family Medicine

## 2024-05-08 ENCOUNTER — Other Ambulatory Visit: Payer: Self-pay | Admitting: Family Medicine

## 2024-06-10 ENCOUNTER — Other Ambulatory Visit: Payer: Self-pay | Admitting: Family Medicine

## 2024-06-18 ENCOUNTER — Encounter: Payer: Self-pay | Admitting: Family Medicine

## 2024-06-18 ENCOUNTER — Ambulatory Visit (INDEPENDENT_AMBULATORY_CARE_PROVIDER_SITE_OTHER): Admitting: Family Medicine

## 2024-06-18 VITALS — BP 120/70 | HR 60 | Temp 98.6°F | Ht 62.5 in | Wt 209.2 lb

## 2024-06-18 DIAGNOSIS — R7303 Prediabetes: Secondary | ICD-10-CM

## 2024-06-18 DIAGNOSIS — K219 Gastro-esophageal reflux disease without esophagitis: Secondary | ICD-10-CM

## 2024-06-18 DIAGNOSIS — E669 Obesity, unspecified: Secondary | ICD-10-CM

## 2024-06-18 DIAGNOSIS — Z1231 Encounter for screening mammogram for malignant neoplasm of breast: Secondary | ICD-10-CM

## 2024-06-18 DIAGNOSIS — Z1159 Encounter for screening for other viral diseases: Secondary | ICD-10-CM

## 2024-06-18 DIAGNOSIS — Z0000 Encounter for general adult medical examination without abnormal findings: Secondary | ICD-10-CM

## 2024-06-18 DIAGNOSIS — Z114 Encounter for screening for human immunodeficiency virus [HIV]: Secondary | ICD-10-CM | POA: Diagnosis not present

## 2024-06-18 DIAGNOSIS — E559 Vitamin D deficiency, unspecified: Secondary | ICD-10-CM

## 2024-06-18 DIAGNOSIS — I10 Essential (primary) hypertension: Secondary | ICD-10-CM | POA: Diagnosis not present

## 2024-06-18 LAB — COMPREHENSIVE METABOLIC PANEL WITH GFR
ALT: 14 U/L (ref 0–35)
AST: 16 U/L (ref 0–37)
Albumin: 4.4 g/dL (ref 3.5–5.2)
Alkaline Phosphatase: 98 U/L (ref 39–117)
BUN: 17 mg/dL (ref 6–23)
CO2: 24 meq/L (ref 19–32)
Calcium: 9.4 mg/dL (ref 8.4–10.5)
Chloride: 104 meq/L (ref 96–112)
Creatinine, Ser: 0.72 mg/dL (ref 0.40–1.20)
GFR: 96.78 mL/min (ref >60.00)
Glucose, Bld: 95 mg/dL (ref 70–99)
Potassium: 4.2 meq/L (ref 3.5–5.1)
Sodium: 138 meq/L (ref 135–145)
Total Bilirubin: 0.4 mg/dL (ref 0.2–1.2)
Total Protein: 7.9 g/dL (ref 6.0–8.3)

## 2024-06-18 LAB — LIPID PANEL
Cholesterol: 144 mg/dL (ref 0–200)
HDL: 45.6 mg/dL (ref >39.00)
LDL Cholesterol: 90 mg/dL (ref 0–99)
NonHDL: 98.62
Total CHOL/HDL Ratio: 3
Triglycerides: 43 mg/dL (ref 0.0–149.0)
VLDL: 8.6 mg/dL (ref 0.0–40.0)

## 2024-06-18 LAB — VITAMIN D 25 HYDROXY (VIT D DEFICIENCY, FRACTURES): VITD: 25.76 ng/mL — ABNORMAL LOW (ref 30.00–100.00)

## 2024-06-18 LAB — MAGNESIUM: Magnesium: 2.3 mg/dL (ref 1.5–2.5)

## 2024-06-18 LAB — HEMOGLOBIN A1C: Hgb A1c MFr Bld: 6.6 % — ABNORMAL HIGH (ref 4.6–6.5)

## 2024-06-18 LAB — TSH: TSH: 2.3 u[IU]/mL (ref 0.35–5.50)

## 2024-06-18 LAB — VITAMIN B12: Vitamin B-12: 263 pg/mL (ref 211–911)

## 2024-06-18 MED ORDER — AMLODIPINE BESYLATE 5 MG PO TABS: 5.0000 mg | ORAL_TABLET | Freq: Every day | ORAL | 1 refills | Status: AC

## 2024-06-18 MED ORDER — LOSARTAN POTASSIUM 50 MG PO TABS
50.0000 mg | ORAL_TABLET | Freq: Every day | ORAL | 1 refills | Status: AC
Start: 2024-06-18 — End: ?

## 2024-06-18 NOTE — Progress Notes (Signed)
 Complete physical exam  Patient: Debra Wagner   DOB: 1970-12-19   53 y.o. Female  MRN: [id number]  Subjective:    Chief Complaint  Patient presents with   Annual Exam    Debra Wagner is a 53 y.o. female who presents today for a complete physical exam. She reports consuming a general and low calorie diet. Is eating a high protein lower carb diet. Eats higher fiber. Gym/ health club routine includes cardio and lifting weights 3-4 times per week. She generally feels well. She reports sleeping fairly well. She does not have additional problems to discuss today.    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   cetirizine  (ZYRTEC ) 10 MG tablet Take 1 tablet (10 mg total) by mouth daily.   fluticasone  (FLONASE ) 50 MCG/ACT nasal spray Place 2 sprays into both nostrils daily.   Multiple Vitamin (MULTIVITAMIN) capsule Take 1 capsule by mouth daily.   omeprazole  (PRILOSEC) 20 MG capsule  TAKE 1 CAPSULE BY MOUTH EVERY DAY   sertraline  (ZOLOFT ) 50 MG tablet TAKE 1 TABLET BY MOUTH EVERY DAY   [DISCONTINUED] amLODipine  (NORVASC ) 5 MG tablet TAKE 1 TABLET (5 MG TOTAL) BY MOUTH DAILY.   [DISCONTINUED] buPROPion  ER (WELLBUTRIN  SR) 100 MG 12 hr tablet TAKE 1 TABLET BY MOUTH TWICE A DAY   [DISCONTINUED] losartan  (COZAAR ) 50 MG tablet TAKE 1 TABLET BY MOUTH EVERY DAY   [DISCONTINUED] Phentermine  HCl 8 MG TABS Take 1 tablet (8 mg total) by mouth 2 (two) times daily.   [DISCONTINUED] topiramate  (TOPAMAX ) 50 MG tablet Take 0.5 tablets (25 mg total) by mouth at bedtime for 7 days, THEN 1 tablet (50 mg total) at bedtime for 7 days, THEN 1 tablet (50 mg total) 2 (two) times daily for 7 days.   No facility-administered medications prior to visit.    Review of Systems  HENT:  Negative for hearing loss.   Eyes:  Negative for blurred vision.  Respiratory:  Negative for shortness of breath.   Cardiovascular:  Negative for chest pain.  Gastrointestinal: Negative.   Genitourinary: Negative.   Musculoskeletal:  Negative for back pain.  Neurological:  Negative for headaches.  Psychiatric/Behavioral:  Negative for depression.        Objective:     BP 120/70   Pulse 60   Temp 98.6 F (37 C) (Oral)   Ht 5' 2.5 (1.588  m)   Wt 209 lb 3.2 oz (94.9 kg)   LMP 11/03/2023 (Approximate)   SpO2 99%   BMI 37.65 kg/m    Physical Exam Vitals and nursing note reviewed.  Constitutional:      Appearance: Normal appearance. She is well-groomed. She is obese.  HENT:     Right Ear: Tympanic membrane and ear canal normal.     Left Ear: Tympanic membrane and ear canal normal.     Mouth/Throat:     Mouth: Mucous membranes are moist.     Pharynx: No posterior oropharyngeal erythema.  Eyes:     Conjunctiva/sclera: Conjunctivae normal.  Neck:     Thyroid : No thyromegaly.  Cardiovascular:     Rate and Rhythm: Normal rate and regular rhythm.     Pulses: Normal pulses.     Heart sounds: S1 normal  and S2 normal.  Pulmonary:     Effort: Pulmonary effort is normal.     Breath sounds: Normal breath sounds and air entry.  Abdominal:     General: Abdomen is flat. Bowel sounds are normal.     Palpations: Abdomen is soft.  Musculoskeletal:     Right lower leg: No edema.     Left lower leg: No edema.  Lymphadenopathy:     Cervical: No cervical adenopathy.  Neurological:     Mental Status: She is alert and oriented to person, place, and time. Mental status is at baseline.     Gait: Gait is intact.  Psychiatric:        Mood and Affect: Mood and affect normal.        Speech: Speech normal.        Behavior: Behavior normal.        Judgment: Judgment normal.         Assessment & Plan:    Routine Health Maintenance and Physical Exam  Immunization History  Administered Date(s) Administered   Dtap, Unspecified 05/20/1973, 04/18/1974, 07/07/1974, 03/23/1977   Influenza,inj,Quad PF,6+ Mos 10/13/2013, 09/07/2014, 06/25/2015, 12/10/2017, 07/05/2019   MMR 04/21/1974, 07/16/2002   PFIZER(Purple Top)SARS-COV-2 Vaccination 11/28/2019, 12/23/2019, 09/15/2020, 09/28/2020   PPD Test 01/19/2015   Polio, Unspecified 05/20/1973, 04/18/1974, 07/07/1974, 03/23/1977   Td (Adult),unspecified 02/05/2002   Tdap 10/13/2013, 06/18/2016    Health Maintenance  Topic Date Due   Hepatitis B Vaccines 19-59 Average Risk (1 of 3 - 19+ 3-dose series) Never done   Cervical Cancer Screening (HPV/Pap Cotest)  01/31/2022   Pneumococcal Vaccine: 50+ Years (1 of 1 - PCV) Never done   Zoster Vaccines- Shingrix (1 of 2) Never done   COVID-19 Vaccine (5 - 2025-26 season) 06/02/2024   Influenza Vaccine  12/30/2024 (Originally 05/02/2024)   Fecal DNA (Cologuard)  08/14/2025   Mammogram  08/23/2025   DTaP/Tdap/Td (7 - Td or Tdap) 06/18/2026   Hepatitis C Screening  Completed   HIV Screening  Completed   HPV VACCINES  Aged Out   Meningococcal B Vaccine  Aged Out    Discussed health benefits of physical activity,  and encouraged her to engage in regular exercise appropriate for her age and condition.  Encounter for screening for HIV -     HIV Antibody (routine testing w rflx); Future  Need for hepatitis C screening test -     Hepatitis C antibody; Future  Primary hypertension -     Magnesium; Future -     Comprehensive metabolic panel with GFR; Future -     amLODIPine  Besylate; Take 1 tablet (5 mg total)  by mouth daily.  Dispense: 90 tablet; Refill: 1 -     Losartan  Potassium; Take 1 tablet (50 mg total) by mouth daily.  Dispense: 90 tablet; Refill: 1  Obesity (BMI 30-39.9) -     Lipid panel; Future -     TSH; Future  Prediabetes -     Hemoglobin A1c; Future  Vitamin D  deficiency -     VITAMIN D  25 Hydroxy (Vit-D Deficiency, Fractures); Future  GERD without esophagitis -     Vitamin B12; Future  Routine general medical examination at a health care facility  Breast cancer screening by mammogram -     3D Screening Mammogram, Left and Right; Future  General physical exam findings are normal today. I reviewed the patient's preventative testing, immunizations, and lifestyle habits. I made appropriate recommendations and placed orders for the appropriate tests and/or vaccinations. I counseled the patient on the CDC's recommendations for healthy exercise and diet. I counseled the patient on healthy sleep habits and stress management. Handouts to reinforce the counseling were given at the conclusion of the visit.    Return in 1 year (on 06/18/2025).     Heron CHRISTELLA Sharper, MD

## 2024-06-18 NOTE — Patient Instructions (Signed)

## 2024-06-19 LAB — HEPATITIS C ANTIBODY: Hepatitis C Ab: NONREACTIVE

## 2024-06-19 LAB — HIV ANTIBODY (ROUTINE TESTING W REFLEX)
HIV 1&2 Ab, 4th Generation: NONREACTIVE
HIV FINAL INTERPRETATION: NEGATIVE

## 2024-06-20 ENCOUNTER — Ambulatory Visit: Payer: Self-pay | Admitting: Family Medicine

## 2024-06-23 DIAGNOSIS — N951 Menopausal and female climacteric states: Secondary | ICD-10-CM | POA: Diagnosis not present

## 2024-06-23 DIAGNOSIS — N949 Unspecified condition associated with female genital organs and menstrual cycle: Secondary | ICD-10-CM | POA: Diagnosis not present

## 2024-06-23 DIAGNOSIS — N926 Irregular menstruation, unspecified: Secondary | ICD-10-CM | POA: Diagnosis not present

## 2024-06-27 DIAGNOSIS — Z3202 Encounter for pregnancy test, result negative: Secondary | ICD-10-CM | POA: Diagnosis not present

## 2024-07-08 DIAGNOSIS — K5904 Chronic idiopathic constipation: Secondary | ICD-10-CM | POA: Diagnosis not present

## 2024-07-08 DIAGNOSIS — R141 Gas pain: Secondary | ICD-10-CM | POA: Diagnosis not present

## 2024-07-30 ENCOUNTER — Ambulatory Visit (INDEPENDENT_AMBULATORY_CARE_PROVIDER_SITE_OTHER): Admitting: Otolaryngology

## 2024-08-01 ENCOUNTER — Ambulatory Visit (INDEPENDENT_AMBULATORY_CARE_PROVIDER_SITE_OTHER): Admitting: Otolaryngology

## 2024-08-01 ENCOUNTER — Encounter (INDEPENDENT_AMBULATORY_CARE_PROVIDER_SITE_OTHER): Payer: Self-pay | Admitting: Otolaryngology

## 2024-08-01 VITALS — BP 136/79 | HR 60 | Temp 98.1°F | Ht 63.0 in | Wt 210.0 lb

## 2024-08-01 DIAGNOSIS — R0981 Nasal congestion: Secondary | ICD-10-CM | POA: Diagnosis not present

## 2024-08-01 DIAGNOSIS — J342 Deviated nasal septum: Secondary | ICD-10-CM | POA: Diagnosis not present

## 2024-08-01 DIAGNOSIS — J310 Chronic rhinitis: Secondary | ICD-10-CM

## 2024-08-01 DIAGNOSIS — J343 Hypertrophy of nasal turbinates: Secondary | ICD-10-CM | POA: Diagnosis not present

## 2024-08-01 DIAGNOSIS — H93A1 Pulsatile tinnitus, right ear: Secondary | ICD-10-CM | POA: Diagnosis not present

## 2024-08-01 DIAGNOSIS — H6981 Other specified disorders of Eustachian tube, right ear: Secondary | ICD-10-CM | POA: Diagnosis not present

## 2024-08-01 MED ORDER — FLUTICASONE PROPIONATE 50 MCG/ACT NA SUSP: 2.0000 | Freq: Every day | NASAL | 10 refills | Status: AC

## 2024-08-01 NOTE — Progress Notes (Signed)
 Patient ID: Debra Wagner, female   DOB: [birth year], 52 y.o.   MRN: [id number]  Follow up: Chronic nasal congestion New complaint: Right ear pulsatile tinnitus  History of Present Illness Debra Wagner is a 53 year old female who presents today with a new complaint of pulsatile tinnitus in the right ear.  She was previously seen for chronic nasal congestion.  She experiences intermittent whooshing sounds in her right ear, which she sometimes feels may be synchronized with her heartbeat.  She also complains of a clogging sensation in the right ear.  She has a history of chronic nasal congestion, deviated septum, and swollen turbinates, for which she was prescribed Flonase . She has not been using the nasal spray consistently over the past few months. Recently, she has experienced sneezing, which she attributes to possible fall allergies.  Currently she denies any facial pain, fever, or visual change.  Exam: General: Communicates without difficulty, well nourished, no acute distress. Head: Normocephalic, no evidence injury, no tenderness, facial buttresses intact without stepoff. Face/sinus: No tenderness to palpation and percussion. Facial movement is normal and symmetric. Eyes: PERRL, EOMI. No scleral icterus, conjunctivae clear. Neuro: CN II exam reveals vision grossly intact.  No nystagmus at any point of gaze. Ears: Auricles well formed without lesions.  Ear canals are intact without mass or lesion.  No erythema or edema is appreciated.  The TMs are intact without fluid. Nose: External evaluation reveals normal support and skin without lesions.  Dorsum is intact.  Anterior rhinoscopy reveals congested mucosa over anterior aspect of inferior turbinates and deviated septum.  No purulence noted. Oral:  Oral cavity and oropharynx are intact, symmetric, without erythema or edema.  Mucosa is moist without lesions. Neck: Full range of motion without pain.  There is no significant lymphadenopathy.  No masses  palpable.  Thyroid  bed within normal limits to palpation.  Parotid glands and submandibular glands equal bilaterally without mass.  Trachea is midline. Neuro:  CN 2-12 grossly intact.    Assessment and Plan Assessment & Plan Right ear pulsatile tinnitus and eustachian tube dysfunction Intermittent whooshing sound in the right ear, possibly synchronized with heartbeat. Eardrum and middle ear space are clear.  - Ordered MRI/CT angiography to rule out serious causes of pulsatile tinnitus. - Prescribed Flonase  nasal spray, two sprays in each nostril once daily. - Instructed on Valsalva maneuver, 20-30 times daily.  Chronic rhinitis with nasal mucosal congestion, nasal septal deviation, and bilateral inferior turbinate hypertrophy. - Flonase  nasal spray 2 sprays each nostril daily. - The patient will return for reevaluation after her MRI/CT angiography.

## 2024-08-12 ENCOUNTER — Ambulatory Visit (HOSPITAL_COMMUNITY): Admission: RE | Admit: 2024-08-12 | Source: Ambulatory Visit

## 2024-08-13 ENCOUNTER — Ambulatory Visit: Payer: Self-pay | Admitting: *Deleted

## 2024-08-13 NOTE — Telephone Encounter (Signed)
 FYI Only or Action Required?: FYI only for provider: appointment scheduled on 11/13.  Patient was last seen in primary care on 06/18/2024 by Ozell Heron HERO, MD.  Called Nurse Triage reporting Ear Fullness.  Symptoms began several weeks ago.  Interventions attempted: OTC medications: Zyrtec  .  Symptoms are: unchanged.  Triage Disposition: See PCP When Office is Open (Within 3 Days)  Patient/caregiver understands and will follow disposition?: YesCopied from CRM #[id number]. Topic: Clinical - Red Word Triage >> Aug 13, 2024 10:18 AM Rosina BIRCH wrote: Red Word that prompted transfer to Nurse Triage: popping in her ear for two weeks, headache, and de congestion Reason for Disposition  [1] Ear congestion lasts > 3 days AND [2] no improvement after using Care Advice  (Exception: Ear congestion is a chronic symptom.)  Answer Assessment - Initial Assessment Questions 1. LOCATION: Which ear is involved?       Right ear 2. SENSATION: Describe how the ear feels. (e.g., stuffy, full, plugged).      Whooshing feeling like water in the ear 3. ONSET:  When did the ear symptoms start?       2 weeks 4. PAIN: Do you also have an earache? If Yes, ask: How bad is it? (Scale 0-10; none, mild, moderate or severe)     No- faint pain 5. CAUSE: What do you think is causing the ear congestion? (e.g., common cold, nasal allergies, recent flight, recent snorkeling)     congestion 6. OTHER SYMPTOMS: Do you have any other symptoms? (e.g., ear drainage, hay fever symptoms such as sneezing or a clear nasal discharge; cold symptoms such as a cough or runny nose)     headache  Protocols used: Ear - Congestion-A-AH

## 2024-08-13 NOTE — Telephone Encounter (Signed)
 Appt on 11/13.

## 2024-08-14 ENCOUNTER — Ambulatory Visit: Admitting: Family Medicine

## 2024-08-14 ENCOUNTER — Encounter: Payer: Self-pay | Admitting: Family Medicine

## 2024-08-14 VITALS — BP 122/88 | HR 70 | Temp 99.3°F | Ht 63.0 in | Wt 209.7 lb

## 2024-08-14 DIAGNOSIS — J0120 Acute ethmoidal sinusitis, unspecified: Secondary | ICD-10-CM

## 2024-08-14 DIAGNOSIS — R7303 Prediabetes: Secondary | ICD-10-CM

## 2024-08-14 DIAGNOSIS — H93A1 Pulsatile tinnitus, right ear: Secondary | ICD-10-CM | POA: Diagnosis not present

## 2024-08-14 MED ORDER — METFORMIN HCL 500 MG PO TABS: 500.0000 mg | ORAL_TABLET | Freq: Two times a day (BID) | ORAL | 0 refills | Status: AC

## 2024-08-14 MED ORDER — AMOXICILLIN-POT CLAVULANATE 875-125 MG PO TABS: 1.0000 | ORAL_TABLET | Freq: Two times a day (BID) | ORAL | 0 refills | Status: AC

## 2024-08-14 NOTE — Progress Notes (Signed)
 Acute Office Visit  Subjective:     Patient ID: Debra Wagner, female    DOB: 1971/03/26, 53 y.o.   MRN: [id number]  Chief Complaint  Patient presents with   Ear Fullness    Patient complains of squishing sound in the right ear x2 weeks, seen by ENT also    Ear Fullness    Discussed the use of AI scribe software for clinical note transcription with the patient, who gave verbal consent to proceed.  History of Present Illness   Debra Wagner is a 53 year old female with chronic sinus congestion who presents with right ear whooshing and pressure.  She experiences a whooshing sensation and pressure in her right ear, which began yesterday. The sensation is intermittent and sometimes extends to the left ear with less intensity. She has a history of similar ear issues, having visited an ENT last year for ear pain without whooshing.  She has been prescribed Flonase  and recently assured of its safety with Zyrtec . She notes a low-grade fever of 99.40F, which is higher than her usual temperature. There is a sensation of drainage behind her throat when swallowing.  Chronic sinus congestion and allergies have been managed with Zyrtec  and Excedrin Migraine, providing relief from headaches. She takes vitamin D  supplements for low levels. She experiences a sensation of water in her ear and can pop her ears.  She monitors her blood pressure at home, often verifying high readings at [facility]. She is on amlodipine  and losartan  for blood pressure management. The whooshing sensation is more prominent during physical activity but subsides with rest.  She has been more anxious recently, particularly about an upcoming MRI, though she does not experience claustrophobia. She maintains a routine of walking 30 minutes daily, despite challenges on weekends. She feels congested.       Review of Systems  All other systems reviewed and are negative.       Objective:    BP 122/88   Pulse 70   Temp 99.3 F  (37.4 C) (Oral)   Ht 5' 3 (1.6 m)   Wt 209 lb 11.2 oz (95.1 kg)   SpO2 99%   BMI 37.15 kg/m    Physical Exam Vitals reviewed.  Constitutional:      Appearance: Normal appearance. She is obese.  HENT:     Right Ear: A middle ear effusion is present. Tympanic membrane is not erythematous.     Left Ear: Tympanic membrane normal. Tympanic membrane is not erythematous.     Mouth/Throat:     Mouth: Mucous membranes are moist.     Pharynx: No posterior oropharyngeal erythema.  Cardiovascular:     Rate and Rhythm: Normal rate and regular rhythm.     Heart sounds: Normal heart sounds. No murmur heard. Pulmonary:     Effort: Pulmonary effort is normal.  Neurological:     Mental Status: She is alert.     No results found for any visits on 08/14/24.      Assessment & Plan:   Problem List Items Addressed This Visit       Unprioritized   Prediabetes   Relevant Medications   metFORMIN  (GLUCOPHAGE ) 500 MG tablet   Subjective pulsatile tinnitus of right ear - Primary   Other Visit Diagnoses       Acute non-recurrent ethmoidal sinusitis       Relevant Medications   amoxicillin-clavulanate (AUGMENTIN) 875-125 MG tablet     Assessment and Plan    Acute  right ear effusion with pulsatile tinnitus Intermittent whooshing sensation and heaviness in the right ear, with occasional pain. No fluid observed by ENT, but effusion noted on examination. Differential includes sinus issues, blood pressure changes, and anxiety. MRI scheduled to rule out tumors, sinus issues, and mastoiditis. Discussed potential causes including sinusitis and anxiety. Explained that the whooshing sensation can be normal, especially with exercise or stress, but persistent symptoms warrant further investigation. - Proceed with scheduled MRI on November 24th to rule out tumors, sinus issues, and mastoiditis. - Prescribed Augmentin for one week to address potential sinus infection. - Continue Flonase  and Zyrtec  for  allergy management. - Monitor blood pressure at home using an upper arm cuff for accuracy.  Acute ethmoidal sinusitis Persistent sinus congestion and pressure, with low-grade fever of 99.18F. Symptoms have been ongoing for more than two weeks. Possible sinus infection considered due to effusion and fever. Discussed potential benefits of antibiotics, including relief of symptoms, and risks such as diarrhea and yeast infection. Explained that antibiotics may help if symptoms are due to a sinus infection. - Prescribed Augmentin for one week to address potential sinus infection. - Continue Flonase  and Zyrtec  for allergy management. - Monitor symptoms and report improvement after antibiotic course.  Type 2 diabetes mellitus/ Prediabetes A1c increased to 6.6%, indicating diabetes diagnosis. We discussed adding metformin  to her regimen to help with her weight loss and blood sugar. Pt states she has taken it in the past. We discussed risks/benefits of metformin . RTC 3 months for repeat A1C.       Meds ordered this encounter  Medications   amoxicillin-clavulanate (AUGMENTIN) 875-125 MG tablet    Sig: Take 1 tablet by mouth 2 (two) times daily for 7 days.    Dispense:  14 tablet    Refill:  0   metFORMIN  (GLUCOPHAGE ) 500 MG tablet    Sig: Take 1 tablet (500 mg total) by mouth 2 (two) times daily with a meal.    Dispense:  180 tablet    Refill:  0    Return in about 3 months (around 11/14/2024) for A1C check.  Heron CHRISTELLA Sharper, MD

## 2024-08-20 ENCOUNTER — Encounter (INDEPENDENT_AMBULATORY_CARE_PROVIDER_SITE_OTHER): Payer: Self-pay | Admitting: Otolaryngology

## 2024-08-25 ENCOUNTER — Ambulatory Visit
Admission: RE | Admit: 2024-08-25 | Discharge: 2024-08-25 | Disposition: A | Discharge status: Home / Self Care | Source: Ambulatory Visit | Attending: Otolaryngology | Admitting: Otolaryngology

## 2024-08-25 DIAGNOSIS — H93A1 Pulsatile tinnitus, right ear: Secondary | ICD-10-CM | POA: Diagnosis not present

## 2024-08-27 ENCOUNTER — Ambulatory Visit
Admission: RE | Admit: 2024-08-27 | Discharge: 2024-08-27 | Disposition: A | Source: Ambulatory Visit | Attending: Family Medicine

## 2024-08-27 DIAGNOSIS — Z1231 Encounter for screening mammogram for malignant neoplasm of breast: Secondary | ICD-10-CM | POA: Diagnosis not present

## 2024-09-03 ENCOUNTER — Other Ambulatory Visit (INDEPENDENT_AMBULATORY_CARE_PROVIDER_SITE_OTHER): Payer: Self-pay | Admitting: Otolaryngology

## 2024-09-03 DIAGNOSIS — H93A1 Pulsatile tinnitus, right ear: Secondary | ICD-10-CM

## 2024-09-04 ENCOUNTER — Telehealth (INDEPENDENT_AMBULATORY_CARE_PROVIDER_SITE_OTHER): Payer: Self-pay | Admitting: Otolaryngology

## 2024-09-04 NOTE — Telephone Encounter (Signed)
 Per Dr. Karis, I called and spoke with patient and told her that her brain MRI showed no brain tumor.  Dr.Teoh stated that patient needs CT angiography of head. Patient understood.

## 2024-09-05 ENCOUNTER — Other Ambulatory Visit: Payer: Self-pay | Admitting: Otolaryngology

## 2024-09-05 DIAGNOSIS — H93A1 Pulsatile tinnitus, right ear: Secondary | ICD-10-CM

## 2024-09-09 ENCOUNTER — Other Ambulatory Visit: Payer: Self-pay | Admitting: Family Medicine

## 2024-09-12 ENCOUNTER — Inpatient Hospital Stay: Admission: RE | Admit: 2024-09-12 | Discharge: 2024-09-12 | Attending: Otolaryngology | Admitting: Otolaryngology

## 2024-09-12 DIAGNOSIS — H93A1 Pulsatile tinnitus, right ear: Secondary | ICD-10-CM | POA: Diagnosis not present

## 2024-09-12 DIAGNOSIS — I6522 Occlusion and stenosis of left carotid artery: Secondary | ICD-10-CM | POA: Diagnosis not present

## 2024-09-12 MED ORDER — IOPAMIDOL (ISOVUE-370) INJECTION 76%
100.0000 mL | Freq: Once | INTRAVENOUS | Status: AC | PRN
  Administered 2024-09-12: 11:00:00 100 mL via INTRAVENOUS

## 2024-09-13 ENCOUNTER — Other Ambulatory Visit

## 2024-09-18 ENCOUNTER — Telehealth (INDEPENDENT_AMBULATORY_CARE_PROVIDER_SITE_OTHER): Payer: Self-pay | Admitting: Otolaryngology

## 2024-09-18 NOTE — Telephone Encounter (Signed)
 Per Dr. Karis, I told patient that her CT angio head was showed no aneurysm or tumor. Continue using her Flonase . She will follow up in 2 months.

## 2024-10-10 ENCOUNTER — Other Ambulatory Visit: Payer: Self-pay | Admitting: Family Medicine

## 2024-11-07 ENCOUNTER — Encounter: Payer: Self-pay | Admitting: Family Medicine

## 2024-11-10 ENCOUNTER — Ambulatory Visit (INDEPENDENT_AMBULATORY_CARE_PROVIDER_SITE_OTHER): Admitting: Otolaryngology

## 2024-11-14 ENCOUNTER — Ambulatory Visit: Payer: Self-pay | Admitting: Family Medicine
